# Patient Record
Sex: Male | Born: 1955 | Race: White | Hispanic: No | Marital: Married | Smoking: Former smoker
Health system: Southern US, Community
[De-identification: ages and names within clinical notes are randomized; demographics above are authoritative.]

## PROBLEM LIST (undated history)

## (undated) DIAGNOSIS — L409 Psoriasis, unspecified: Secondary | ICD-10-CM

## (undated) HISTORY — PX: TONSILLECTOMY AND ADENOIDECTOMY: SUR1326

## (undated) HISTORY — PX: HERNIA REPAIR: SHX51

## (undated) HISTORY — DX: Psoriasis, unspecified: L40.9

---

## 2009-03-09 ENCOUNTER — Ambulatory Visit: Payer: Self-pay | Admitting: Unknown Physician Specialty

## 2009-03-09 LAB — HM COLONOSCOPY

## 2014-04-28 LAB — FECAL OCCULT BLOOD, GUAIAC: Fecal Occult Blood: NEGATIVE

## 2014-05-13 LAB — LIPID PANEL
Cholesterol: 230 mg/dL — AB (ref 0–200)
HDL: 48 mg/dL (ref 35–70)
LDL Cholesterol: 145 mg/dL
LDL/HDL RATIO: 3
Triglycerides: 183 mg/dL — AB (ref 40–160)

## 2014-05-13 LAB — HEPATIC FUNCTION PANEL
ALT: 34 U/L (ref 10–40)
AST: 34 U/L (ref 14–40)
Alkaline Phosphatase: 76 U/L (ref 25–125)
BILIRUBIN, TOTAL: 0.4 mg/dL

## 2014-05-13 LAB — BASIC METABOLIC PANEL
BUN: 17 mg/dL (ref 4–21)
CREATININE: 1.2 mg/dL (ref 0.6–1.3)
Glucose: 111 mg/dL
Potassium: 4.9 mmol/L (ref 3.4–5.3)
SODIUM: 142 mmol/L (ref 137–147)

## 2014-05-13 LAB — TSH: TSH: 2.15 u[IU]/mL (ref 0.41–5.90)

## 2014-05-13 LAB — CBC AND DIFFERENTIAL
HCT: 42 % (ref 41–53)
HEMOGLOBIN: 14.6 g/dL (ref 13.5–17.5)
NEUTROS ABS: 3 /uL
Platelets: 237 10*3/uL (ref 150–399)
WBC: 4.9 10^3/mL

## 2014-05-13 LAB — HM HEPATITIS C SCREENING LAB: HM Hepatitis Screen: NEGATIVE

## 2014-05-13 LAB — PSA: PSA: 1.9

## 2014-06-11 LAB — FECAL OCCULT BLOOD, GUAIAC: Fecal Occult Blood: NEGATIVE

## 2015-05-05 ENCOUNTER — Encounter: Payer: Self-pay | Admitting: Family Medicine

## 2015-12-29 ENCOUNTER — Ambulatory Visit
Admission: RE | Admit: 2015-12-29 | Discharge: 2015-12-29 | Disposition: A | Discharge status: Home / Self Care | Payer: 59 | Source: Ambulatory Visit | Attending: Otolaryngology | Admitting: Otolaryngology

## 2015-12-29 ENCOUNTER — Other Ambulatory Visit: Payer: Self-pay | Admitting: Otolaryngology

## 2015-12-29 DIAGNOSIS — R05 Cough: Secondary | ICD-10-CM | POA: Diagnosis not present

## 2015-12-29 DIAGNOSIS — R059 Cough, unspecified: Secondary | ICD-10-CM

## 2016-03-28 ENCOUNTER — Ambulatory Visit: Payer: Self-pay | Admitting: Family Medicine

## 2016-03-28 DIAGNOSIS — Z8619 Personal history of other infectious and parasitic diseases: Secondary | ICD-10-CM | POA: Insufficient documentation

## 2016-03-28 DIAGNOSIS — F3130 Bipolar disorder, current episode depressed, mild or moderate severity, unspecified: Secondary | ICD-10-CM | POA: Insufficient documentation

## 2016-03-28 DIAGNOSIS — N486 Induration penis plastica: Secondary | ICD-10-CM | POA: Insufficient documentation

## 2016-03-29 ENCOUNTER — Encounter: Payer: Self-pay | Admitting: Family Medicine

## 2016-03-29 ENCOUNTER — Ambulatory Visit (INDEPENDENT_AMBULATORY_CARE_PROVIDER_SITE_OTHER): Payer: 59 | Admitting: Family Medicine

## 2016-03-29 VITALS — BP 132/94 | HR 80 | Temp 98.6°F | Resp 16 | Wt 252.0 lb

## 2016-03-29 DIAGNOSIS — F320 Major depressive disorder, single episode, mild: Secondary | ICD-10-CM

## 2016-03-29 DIAGNOSIS — R0683 Snoring: Secondary | ICD-10-CM | POA: Diagnosis not present

## 2016-03-29 NOTE — Progress Notes (Signed)
Subjective:  HPI  Patient states his wife told him he snores and gasps for air at night time. Patient feels refresh when he wakes up and feels fine during the day, he wanted to get this checked out because his wife asked.  Prior to Admission medications   Medication Sig Start Date End Date Taking? Authorizing Provider  buPROPion (WELLBUTRIN XL) 150 MG 24 hr tablet Take 150 mg by mouth daily.    Yes Historical Provider, MD  MULTIPLE VITAMIN PO Take by mouth.   Yes Historical Provider, MD  OMEGA-3 FATTY ACIDS PO Take by mouth.   Yes Historical Provider, MD  PARoxetine (PAXIL) 20 MG tablet Take 20 mg by mouth daily.    Yes Historical Provider, MD  clonazePAM (KLONOPIN) 0.5 MG tablet Take 0.5 mg by mouth at bedtime.     Historical Provider, MD    Patient Active Problem List   Diagnosis Date Noted  . Bipolar I disorder, most recent episode depressed ([place]) 03/28/2016  . History of chicken pox 03/28/2016  . Chronic inflammation of tunica albuginea 03/28/2016  . Major depressive disorder, single episode, mild ([place]) 10/24/2007  . Hypercholesterolemia without hypertriglyceridemia 10/24/2007  . Tobacco use 10/24/2007    History reviewed. No pertinent past medical history.  Social History   Social History  . Marital status: Married    Spouse name: N/A  . Number of children: N/A  . Years of education: N/A   Occupational History  . Not on file.   Social History Main Topics  . Smoking status: Former Smoker    Packs/day: 0.50    Years: 10.00    Types: Cigarettes    Quit date: 08/28/1996  . Smokeless tobacco: Former Systems developer    Quit date: 08/28/1996  . Alcohol use No     Comment: none  . Drug use: No  . Sexual activity: Not on file   Other Topics Concern  . Not on file   Social History Narrative  . No narrative on file    Allergies  Allergen Reactions  . Clindamycin/Lincomycin Hives  . Tetracyclines & Related     Mouth lesions/break out    Review of Systems    Constitutional: Negative.   Respiratory: Negative.        Snores and gasps for air at night   Cardiovascular: Negative.   Musculoskeletal: Negative.   Psychiatric/Behavioral: Negative.     Immunization History  Administered Date(s) Administered  . Tdap 10/24/2007   Objective:  BP (!) 132/94   Pulse 80   Temp 98.6 F (37 C)   Resp 16   Wt 252 lb (114.3 kg)   BMI 32.35 kg/m   Physical Exam  Constitutional: He is oriented to person, place, and time and well-developed, well-nourished, and in no distress.  HENT:  Head: Normocephalic and atraumatic.  Eyes: Conjunctivae are normal. Pupils are equal, round, and reactive to light.  Neck: Normal range of motion. Neck supple.  Cardiovascular: Normal rate, regular rhythm, normal heart sounds and intact distal pulses.   No murmur heard. Pulmonary/Chest: Effort normal and breath sounds normal. No respiratory distress. He has no wheezes.  Musculoskeletal: Normal range of motion. He exhibits no edema or tenderness.  Neurological: He is alert and oriented to person, place, and time. Gait normal.  Psychiatric: Mood, memory, affect and judgment normal.    Lab Results  Component Value Date   WBC 4.9 05/13/2014   HGB 14.6 05/13/2014   HCT 42 05/13/2014   PLT  237 05/13/2014   CHOL 230 (A) 05/13/2014   TRIG 183 (A) 05/13/2014   HDL 48 05/13/2014   LDLCALC 145 05/13/2014   TSH 2.15 05/13/2014   PSA 1.9 05/13/2014    CMP     Component Value Date/Time   NA 142 05/13/2014   K 4.9 05/13/2014   BUN 17 05/13/2014   CREATININE 1.2 05/13/2014   AST 34 05/13/2014   ALT 34 05/13/2014   ALKPHOS 76 05/13/2014    Assessment and Plan :  1. Snores Epworth score today is 5. Patient feels fine during the day but per wife he snores and gasps for air. Will refer for sleep study for further evaluation. Discussed with patient what this process means and what has to be done and the importance of following up with [place] after getting his CPAP machine  if he ends up needing it.  Return to clinic in 2 months follow up for possible sleep apnea and for complete physical 2. Major depressive disorder, single episode, mild (HCC) Stable. Follows specialist for this. Followed by psychiatry Patient was seen and examined by Dr. Eulas Post and note was scribed by Theressa Millard, [place].    Miguel Aschoff MD [facility] 03/29/2016 11:29 AM

## 2016-06-02 ENCOUNTER — Telehealth: Payer: Self-pay | Admitting: Family Medicine

## 2016-06-02 NOTE — Telephone Encounter (Signed)
Order for home sleep study faxed to [facility]

## 2016-12-20 IMAGING — CR DG CHEST 2V
2 series · 2 of 2 positions shown · non-contrast
Comparison: Prior report 09/11/2002.

CLINICAL DATA: Cough.

EXAM:
CHEST  2 VIEW

[chest pa]
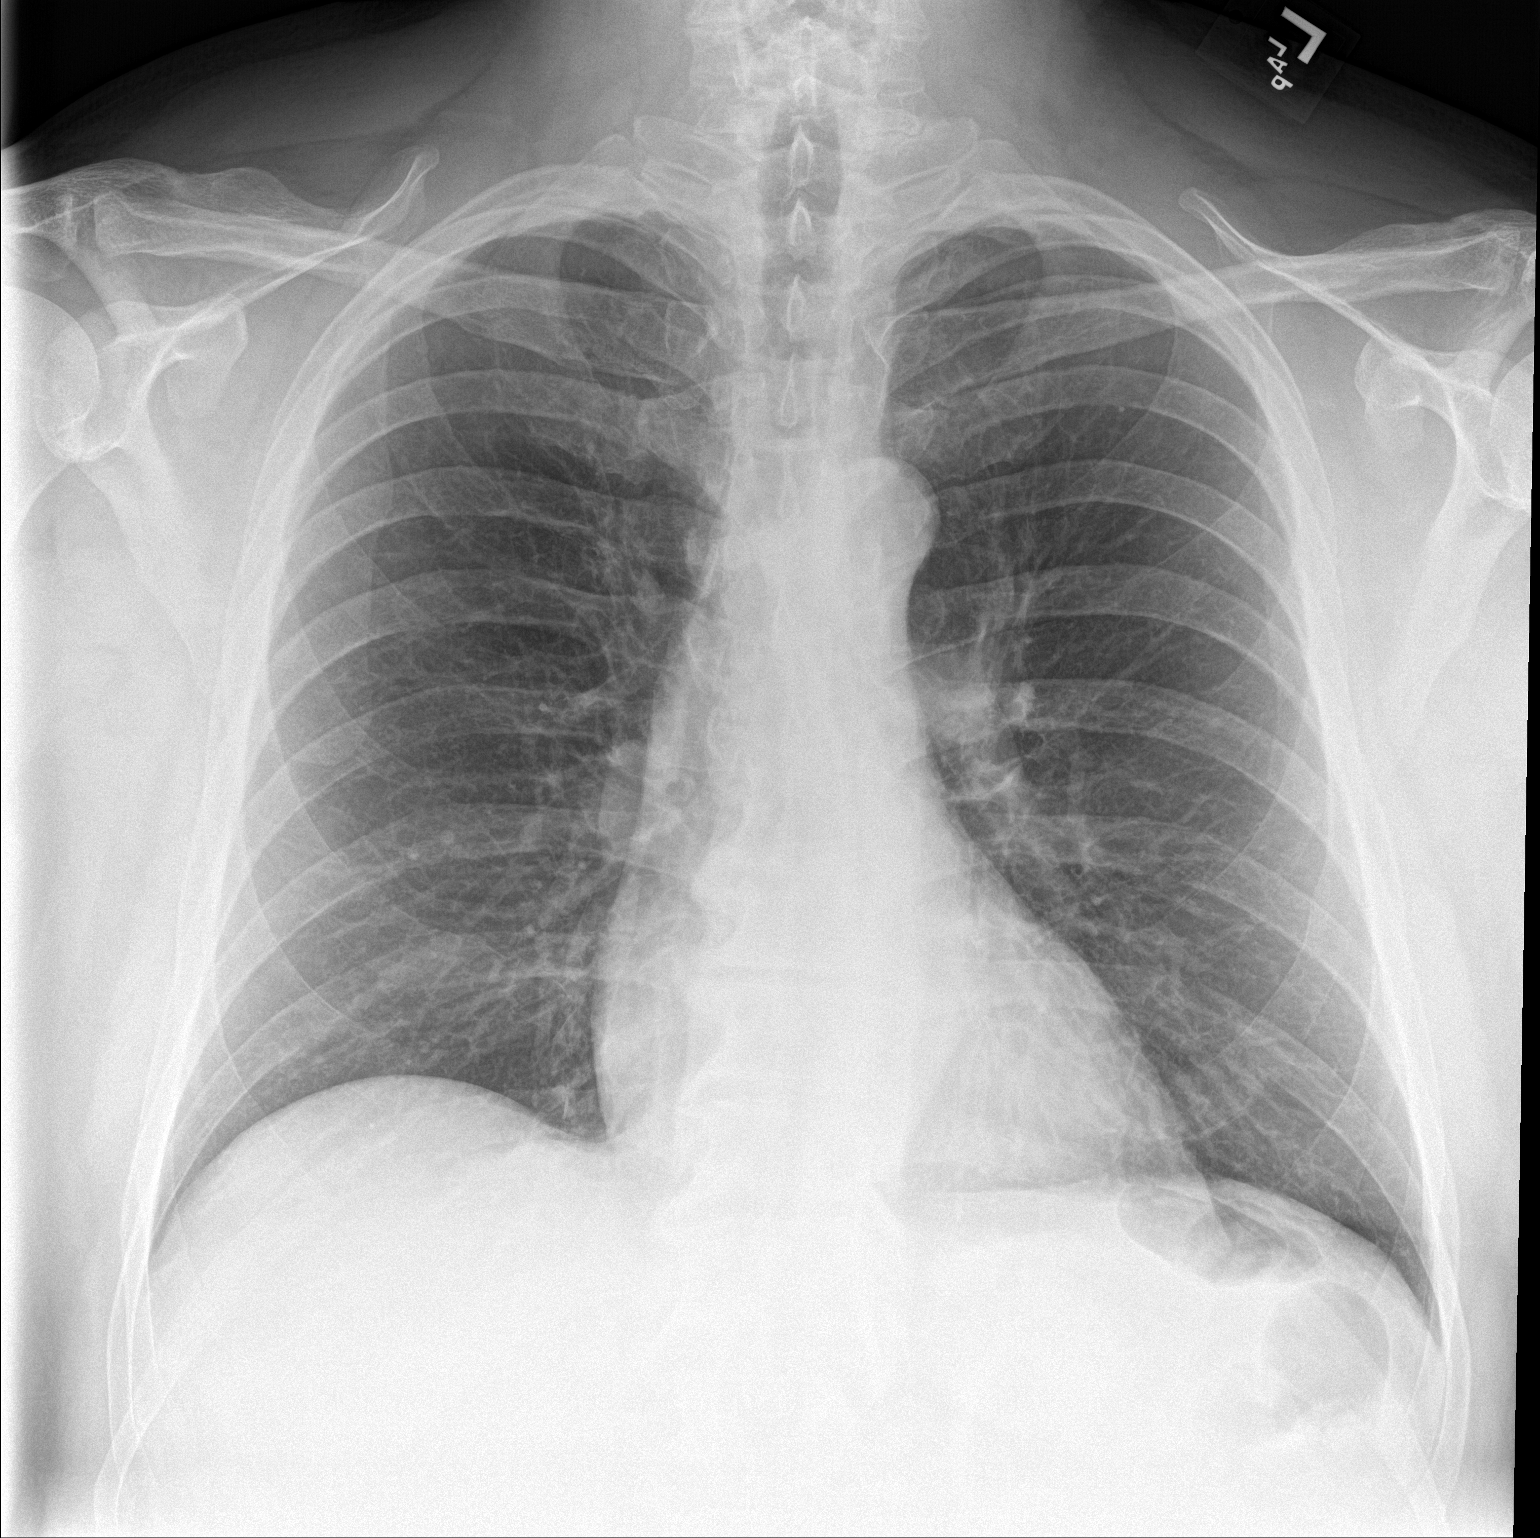

[chest lat]
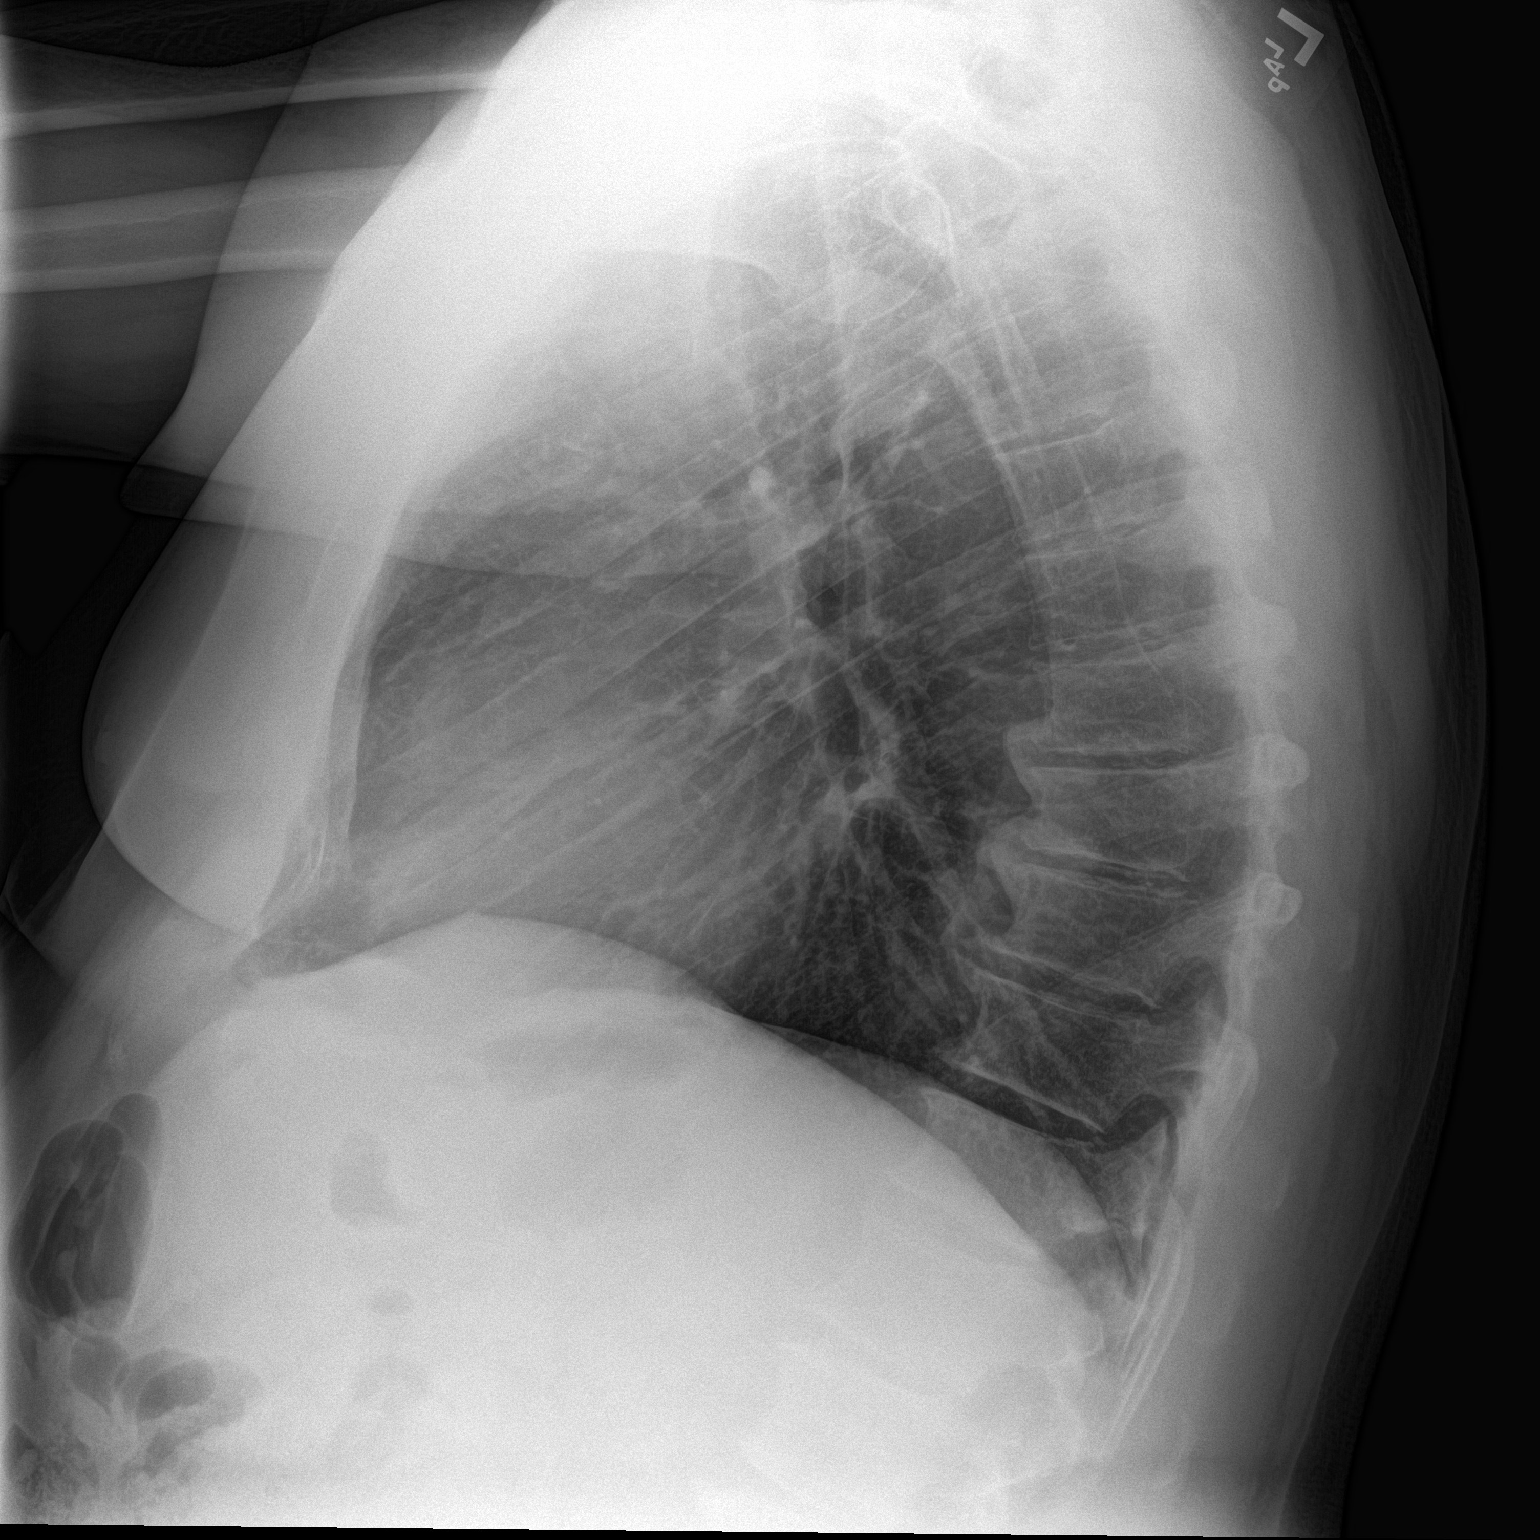

[2 of 2 positions shown; findings below may reference images not displayed]

FINDINGS: Mediastinum and hilar structures normal. Lungs are clear. Heart size
normal. No pleural effusion pneumothorax. Degenerative changes
thoracic spine.
IMPRESSION: No acute cardiopulmonary disease.

## 2018-12-23 ENCOUNTER — Ambulatory Visit: Payer: Self-pay | Admitting: Psychiatry

## 2019-01-14 ENCOUNTER — Other Ambulatory Visit: Payer: Self-pay | Admitting: Psychiatry

## 2019-01-14 NOTE — Telephone Encounter (Signed)
We will hold off doing 90-day refills at this time.  We will wait for his phone call for the refill and reschedule him and then we will do 90-day refills.

## 2019-01-14 NOTE — Telephone Encounter (Signed)
They are also requesting his clonazepam, but he's not been seen since 11/2017.   [place] [facility] tried to reach him to set up an appt., he didn't answer and couldn't leave message.

## 2019-01-29 ENCOUNTER — Other Ambulatory Visit: Payer: Self-pay

## 2019-01-29 MED ORDER — CLONAZEPAM 0.5 MG PO TABS: 0.5000 mg | ORAL_TABLET | Freq: Two times a day (BID) | ORAL | 0 refills | Status: DC | PRN

## 2019-01-29 NOTE — Telephone Encounter (Signed)
Paul Fleming called today to request refills of his paxil, wellbutrin and klonopin.  Next appt 02/12/19;  Send to [facility]

## 2019-02-12 ENCOUNTER — Encounter: Payer: Self-pay | Admitting: Psychiatry

## 2019-02-12 ENCOUNTER — Other Ambulatory Visit: Payer: Self-pay

## 2019-02-12 ENCOUNTER — Ambulatory Visit (INDEPENDENT_AMBULATORY_CARE_PROVIDER_SITE_OTHER): Payer: 59 | Admitting: Psychiatry

## 2019-02-12 DIAGNOSIS — F1021 Alcohol dependence, in remission: Secondary | ICD-10-CM

## 2019-02-12 DIAGNOSIS — F3342 Major depressive disorder, recurrent, in full remission: Secondary | ICD-10-CM

## 2019-02-12 MED ORDER — BUPROPION HCL ER (XL) 300 MG PO TB24: 300.0000 mg | ORAL_TABLET | Freq: Every day | ORAL | 3 refills | Status: DC

## 2019-02-12 MED ORDER — PAROXETINE HCL 20 MG PO TABS: 20.0000 mg | ORAL_TABLET | Freq: Every day | ORAL | 3 refills | Status: DC

## 2019-02-12 NOTE — Progress Notes (Signed)
Paul Fleming [id number] 04/13/1956 63 y.o.  Virtual Visit via Telephone Note  I connected with@ on 02/12/19 at 10:30 AM EDT by telephone and verified that I am speaking with the correct person using two identifiers.   I discussed the limitations, risks, security and privacy concerns of performing an evaluation and management service by telephone and the availability of in person appointments. I also discussed with the patient that there may be a patient responsible charge related to this service. The patient expressed understanding and agreed to proceed.   I discussed the assessment and treatment plan with the patient. The patient was provided an opportunity to ask questions and all were answered. The patient agreed with the plan and demonstrated an understanding of the instructions.   The patient was advised to call back or seek an in-person evaluation if the symptoms worsen or if the condition fails to improve as anticipated.  I provided 15 minutes of non-face-to-face time during this encounter.  The patient was located at home.  The provider was located at [place].   Purnell Shoemaker, MD   Subjective:   Patient ID:  Paul Fleming is a 63 y.o. (DOB [birth year]) male.  Chief Complaint:  Chief Complaint  Patient presents with  . Follow-up    Medication Management  . Depression    Medication Management    HPI Paul Fleming presents for follow-up of recurrent major depression.  History of 4-5 episodes.  Last seen December 24, 2017.  No meds were changed.  He continued paroxetine 20 mg daily and Wellbutrin XL 300 mg daily with rare Klonopin.  Doing fine without depression and anxiety even with stressors.  Mood consistent for years.  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  Grew up in alcoholic family.  Recognizes it and dealing it.  Sober for extended period.  Physically active.  Still rare Klonopin.  No abuse.  Patient  reports stable mood and denies depressed or irritable moods.  Patient denies any recent difficulty with anxiety.  Patient denies difficulty with sleep initiation or maintenance. Denies appetite disturbance.  Patient reports that energy and motivation have been good.  Patient denies any difficulty with concentration.  Patient denies any suicidal ideation.  Past Psychiatric Medication Trials: Paroxetine, Wellbutrin XL 300, olanzapine, lamotrigine 200, clonazepam, imipramine, alprazolam  Review of Systems:  Review of Systems  Neurological: Negative for tremors and weakness.    Medications: I have reviewed the patient's current medications.  Current Outpatient Medications  Medication Sig Dispense Refill  . buPROPion (WELLBUTRIN XL) 300 MG 24 hr tablet Take 1 tablet (300 mg total) by mouth daily. 90 tablet 3  . clonazePAM (KLONOPIN) 0.5 MG tablet Take 1 tablet (0.5 mg total) by mouth 2 (two) times daily as needed for anxiety. 60 tablet 0  . MULTIPLE VITAMIN PO Take by mouth.    . OMEGA-3 FATTY ACIDS PO Take by mouth.    Marland Kitchen PARoxetine (PAXIL) 20 MG tablet Take 1 tablet (20 mg total) by mouth daily. 90 tablet 3   No current facility-administered medications for this visit.     Medication Side Effects: None , no sexual SE. Allergies:  Allergies  Allergen Reactions  . Clindamycin/Lincomycin Hives  . Demeclocycline Other (See Comments)    Mouth lesions/break out  . Lincomycin Hives  . Tetracyclines & Related     Mouth lesions/break out    History reviewed. No pertinent past medical history.  Family History  Problem  Relation Age of Onset  . Depression Mother   . Lung cancer Father   . COPD Father   . Diabetes Father   . Breast cancer Sister   . Alcohol abuse Brother     Social History   Socioeconomic History  . Marital status: Married    Spouse name: Not on file  . Number of children: Not on file  . Years of education: Not on file  . Highest education level: Not on file   Occupational History  . Not on file  Social Needs  . Financial resource strain: Not on file  . Food insecurity    Worry: Not on file    Inability: Not on file  . Transportation needs    Medical: Not on file    Non-medical: Not on file  Tobacco Use  . Smoking status: Former Smoker    Packs/day: 0.50    Years: 10.00    Pack years: 5.00    Types: Cigarettes    Quit date: 08/28/1996    Years since quitting: 22.4  . Smokeless tobacco: Former Systems developer    Quit date: 08/28/1996  Substance and Sexual Activity  . Alcohol use: No    Comment: none  . Drug use: No  . Sexual activity: Not on file  Lifestyle  . Physical activity    Days per week: Not on file    Minutes per session: Not on file  . Stress: Not on file  Relationships  . Social Herbalist on phone: Not on file    Gets together: Not on file    Attends religious service: Not on file    Active member of club or organization: Not on file    Attends meetings of clubs or organizations: Not on file    Relationship status: Not on file  . Intimate partner violence    Fear of current or ex partner: Not on file    Emotionally abused: Not on file    Physically abused: Not on file    Forced sexual activity: Not on file  Other Topics Concern  . Not on file  Social History Narrative  . Not on file    Past Medical History, Surgical history, Social history, and Family history were reviewed and updated as appropriate.   Please see review of systems for further details on the patient's review from today.   Objective:   Physical Exam:  There were no vitals taken for this visit.  Physical Exam Constitutional:      General: He is not in acute distress.    Appearance: He is well-developed.  Musculoskeletal:        General: No deformity.  Neurological:     Mental Status: He is alert and oriented to person, place, and time.     Coordination: Coordination normal.  Psychiatric:        Attention and Perception: He is  attentive. He does not perceive auditory hallucinations.        Mood and Affect: Mood is not anxious or depressed. Affect is not labile, blunt, angry or inappropriate.        Speech: Speech normal.        Behavior: Behavior normal.        Thought Content: Thought content normal. Thought content does not include homicidal or suicidal ideation. Thought content does not include homicidal or suicidal plan.        Cognition and Memory: Cognition normal.  Judgment: Judgment normal.     Comments: Insight intact. No auditory or visual hallucinations. No delusions.      Lab Review:     Component Value Date/Time   NA 142 05/13/2014   K 4.9 05/13/2014   BUN 17 05/13/2014   CREATININE 1.2 05/13/2014   AST 34 05/13/2014   ALT 34 05/13/2014   ALKPHOS 76 05/13/2014       Component Value Date/Time   WBC 4.9 05/13/2014   HGB 14.6 05/13/2014   HCT 42 05/13/2014   PLT 237 05/13/2014    No results found for: POCLITH, LITHIUM   No results found for: PHENYTOIN, PHENOBARB, VALPROATE, CBMZ   .res Assessment: Plan:    Paul Fleming was seen today for follow-up and depression.  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 -     buPROPion (WELLBUTRIN XL) 300 MG 24 hr tablet; Take 1 tablet (300 mg total) by mouth daily.  Alcohol dependence in remission ([place])   Good response with meds.  Cont longterm bc multiple relapses.  W also happy with him on it. Sober and involved in [place] and committed.  Sober since April 2018.  FU 1 year.  Lynder Parents, MD, DFAPA   No future appointments.  No orders of the defined types were placed in this encounter.     -------------------------------

## 2019-09-30 ENCOUNTER — Other Ambulatory Visit: Payer: Self-pay | Admitting: Psychiatry

## 2019-11-06 ENCOUNTER — Ambulatory Visit: Payer: Self-pay | Attending: Internal Medicine

## 2019-11-06 DIAGNOSIS — Z23 Encounter for immunization: Secondary | ICD-10-CM

## 2019-11-06 NOTE — Progress Notes (Signed)
   Covid-19 Vaccination Clinic  Name:  ELWIN HOLLANDS    MRN: [id number] DOB: 07-Feb-1956  11/06/2019  Mr. Corso was observed post Covid-19 immunization for 15 minutes without incident. He was provided with Vaccine Information Sheet and instruction to access the V-Safe system.   Mr. Duyck was instructed to call 911 with any severe reactions post vaccine: Marland Kitchen Difficulty breathing  . Swelling of face and throat  . A fast heartbeat  . A bad rash all over body  . Dizziness and weakness   Immunizations Administered    Name Date Dose VIS Date Route   Pfizer COVID-19 Vaccine 11/06/2019 12:36 PM 0.3 mL 08/08/2019 Intramuscular   Manufacturer: [place]   Lot: [id number]   [place]: [id number]

## 2019-12-03 ENCOUNTER — Ambulatory Visit: Payer: Self-pay | Attending: Internal Medicine

## 2019-12-03 ENCOUNTER — Ambulatory Visit (INDEPENDENT_AMBULATORY_CARE_PROVIDER_SITE_OTHER): Payer: Self-pay | Admitting: Dermatology

## 2019-12-03 ENCOUNTER — Other Ambulatory Visit: Payer: Self-pay

## 2019-12-03 DIAGNOSIS — D239 Other benign neoplasm of skin, unspecified: Secondary | ICD-10-CM

## 2019-12-03 DIAGNOSIS — Z23 Encounter for immunization: Secondary | ICD-10-CM

## 2019-12-03 DIAGNOSIS — L409 Psoriasis, unspecified: Secondary | ICD-10-CM

## 2019-12-03 DIAGNOSIS — D234 Other benign neoplasm of skin of scalp and neck: Secondary | ICD-10-CM

## 2019-12-03 DIAGNOSIS — D1801 Hemangioma of skin and subcutaneous tissue: Secondary | ICD-10-CM

## 2019-12-03 MED ORDER — CALCIPOTRIENE 0.005 % EX CREA: TOPICAL_CREAM | Freq: Two times a day (BID) | CUTANEOUS | 0 refills | Status: DC

## 2019-12-03 NOTE — Progress Notes (Signed)
   Follow-Up Visit   Subjective  Paul Fleming is a 64 y.o. male who presents for the following: Follow-up (Biopsy follow up - scalp. Blue Nevus), Psoriasis (Treating with Clobetasol and is well controlled.), and Other (Spot of left post neck).    The following portions of the chart were reviewed this encounter and updated as appropriate:     Review of Systems: No other skin or systemic complaints.  Objective  Well appearing patient in no apparent distress; mood and affect are within normal limits.  All skin waist up examined.  Objective  left mid lat vertex: Well healed biopsy site.  Objective  left post base of neck: Red papules.   Objective  bilateral elbows: Thin plaques.  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 Clear today. 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 Patient declines treatment today. May consider in the future.  Psoriasis bilateral elbows  Continue Clobetasol qd up to 5 days per week prn. Advised to use sparingly and only as needed. Topical steroids (such as triamcinolone, fluocinolone, fluocinonide, mometasone, clobetasol, halobetasol, betamethasone, hydrocortisone) can cause thinning and lightening of the skin if they are used for too long in the same area. Your physician has selected the right strength medicine for your problem and area affected on the body. Please use your medication only as directed by your physician to prevent side effects. Do not use on face, groin or under arms.  Start Calcipotriene topically.  If it is affordable and works well, it can be used daily without concern for side effects like topical steroids, so use instead of Clobetasol and use Clobetasol only if needed for stubborn areas.  Discussed biologic treatments.  He is being evaluated next week for pain in left fingers.   Advised his radiological exams may show whether consistent with Psoriatic arthritis.  calcipotriene (DOVONOX) 0.005 % cream - bilateral elbows  Return if symptoms worsen or fail to improve.   I, Ashok Cordia, CMA, am acting as scribe for Sarina Ser, MD .

## 2019-12-03 NOTE — Progress Notes (Signed)
   Covid-19 Vaccination Clinic  Name:  TRAYVONNE HELLARD    MRN: [id number] DOB: 1955-10-07  12/03/2019  Mr. Bibbee was observed post Covid-19 immunization for 15 minutes without incident. He was provided with Vaccine Information Sheet and instruction to access the V-Safe system.   Mr. Fichter was instructed to call 911 with any severe reactions post vaccine: Marland Kitchen Difficulty breathing  . Swelling of face and throat  . A fast heartbeat  . A bad rash all over body  . Dizziness and weakness   Immunizations Administered    Name Date Dose VIS Date Route   Pfizer COVID-19 Vaccine 12/03/2019 11:36 AM 0.3 mL 08/08/2019 Intramuscular   Manufacturer: [place]   Lot: [id number]   [place]: [id number]

## 2019-12-04 ENCOUNTER — Encounter: Payer: Self-pay | Admitting: Dermatology

## 2020-02-12 ENCOUNTER — Telehealth (INDEPENDENT_AMBULATORY_CARE_PROVIDER_SITE_OTHER): Payer: Self-pay | Admitting: Psychiatry

## 2020-02-12 ENCOUNTER — Encounter: Payer: Self-pay | Admitting: Psychiatry

## 2020-02-12 DIAGNOSIS — F5105 Insomnia due to other mental disorder: Secondary | ICD-10-CM

## 2020-02-12 DIAGNOSIS — F3342 Major depressive disorder, recurrent, in full remission: Secondary | ICD-10-CM

## 2020-02-12 MED ORDER — PAROXETINE HCL 20 MG PO TABS: 20.0000 mg | ORAL_TABLET | Freq: Every day | ORAL | 3 refills | Status: DC

## 2020-02-12 MED ORDER — CLONAZEPAM 0.5 MG PO TABS: ORAL_TABLET | ORAL | 2 refills | Status: DC

## 2020-02-12 MED ORDER — BUPROPION HCL ER (XL) 300 MG PO TB24: 300.0000 mg | ORAL_TABLET | Freq: Every day | ORAL | 3 refills | Status: DC

## 2020-02-12 NOTE — Progress Notes (Signed)
Paul Fleming [id number] [birth year] 64 y.o.  Virtual Visit via Telephone Note  I connected with@ on 02/12/20 at  9:00 AM EDT by telephone and verified that I am speaking with the correct person using two identifiers.   I discussed the limitations, risks, security and privacy concerns of performing an evaluation and management service by telephone and the availability of in person appointments. I also discussed with the patient that there may be a patient responsible charge related to this service. The patient expressed understanding and agreed to proceed.   I discussed the assessment and treatment plan with the patient. The patient was provided an opportunity to ask questions and all were answered. The patient agreed with the plan and demonstrated an understanding of the instructions.   The patient was advised to call back or seek an in-person evaluation if the symptoms worsen or if the condition fails to improve as anticipated.  I provided 15 minutes of non-face-to-face time during this encounter.  The patient was located at home.  The provider was located at [place].   Purnell Shoemaker, MD   Subjective:   Patient ID:  Paul Fleming is a 64 y.o. (DOB Sep 26, 1955) male.  Chief Complaint:  Chief Complaint  Patient presents with  . Follow-up    anxity and depression and sleep    HPI Paul Fleming presents for follow-up of recurrent major depression.  History of 4-5 episodes.  Last seen 1 year ago.  No meds were changed.  He continued paroxetine 20 mg daily and Wellbutrin XL 300 mg daily with rare Klonopin.  Had stopped meds years ago with relapse.    Doing fine without depression and anxiety even with stressors.  Mood consistent for years.  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  Grew up in alcoholic family.  Recognizes it and dealing it.  Sober for extended period.  Physically active.  Still rare Klonopin.  No  abuse.  Patient reports stable mood and denies depressed or irritable moods.  Patient denies any recent difficulty with anxiety.  Patient denies difficulty with sleep initiation or maintenance. Denies appetite disturbance.  Patient reports that energy and motivation have been good.  Patient denies any difficulty with concentration.  Patient denies any suicidal ideation.  Disc concerns about his son's depression.    Past Psychiatric Medication Trials: Paroxetine, Wellbutrin XL 300, olanzapine, lamotrigine 200,  imipramine, clonazepam, alprazolam  Review of Systems:  Review of Systems  HENT: Positive for congestion.   Gastrointestinal: Negative for constipation.  Neurological: Negative for tremors and weakness.    Medications: I have reviewed the patient's current medications.  Current Outpatient Medications  Medication Sig Dispense Refill  . buPROPion (WELLBUTRIN XL) 300 MG 24 hr tablet Take 1 tablet (300 mg total) by mouth daily. 90 tablet 3  . calcipotriene (DOVONOX) 0.005 % cream Apply topically 2 (two) times daily. 60 g 0  . Clobetasol Propionate (TEMOVATE) 0.05 % external spray APPLY A SMALL AMOUNT TO AFFECTED AREAS OF PSORIASIS ON BODY ONCE DAILY AS NEEDED FOR UP TO 5 DAYS A WEEK. AVOID FACE, GROIN AND AXILLA    . clonazePAM (KLONOPIN) 0.5 MG tablet TAKE ONE TABLET BY MOUTH TWICE DAILY AS NEEDED FOR ANXEITY 60 tablet 2  . KERYDIN 5 % SOLN Apply  a small amount via applicator every night  apply to affected toenail    . meloxicam (MOBIC) 15 MG tablet Take 15 mg by mouth daily.    Marland Kitchen  MULTIPLE VITAMIN PO Take by mouth.    . OMEGA-3 FATTY ACIDS PO Take by mouth.    Marland Kitchen PARoxetine (PAXIL) 20 MG tablet Take 1 tablet (20 mg total) by mouth daily. 90 tablet 3   No current facility-administered medications for this visit.    Medication Side Effects: None , no sexual SE. Allergies:  Allergies  Allergen Reactions  . Clindamycin/Lincomycin Hives  . Demeclocycline Other (See Comments)     Mouth lesions/break out  . Lincomycin Hives  . Tetracyclines & Related     Mouth lesions/break out    Past Medical History:  Diagnosis Date  . Psoriasis     Family History  Problem Relation Age of Onset  . Depression Mother   . Lung cancer Father   . COPD Father   . Diabetes Father   . Breast cancer Sister   . Alcohol abuse Brother     Social History   Socioeconomic History  . Marital status: Married    Spouse name: Not on file  . Number of children: Not on file  . Years of education: Not on file  . Highest education level: Not on file  Occupational History  . Not on file  Tobacco Use  . Smoking status: Former Smoker    Packs/day: 0.50    Years: 10.00    Pack years: 5.00    Types: Cigarettes    Quit date: 08/28/1996    Years since quitting: 23.4  . Smokeless tobacco: Former Systems developer    Quit date: 08/28/1996  Substance and Sexual Activity  . Alcohol use: No    Comment: none  . Drug use: No  . Sexual activity: Not on file  Other Topics Concern  . Not on file  Social History Narrative  . Not on file   Social Determinants of Health   Financial Resource Strain:   . Difficulty of Paying Living Expenses:   Food Insecurity:   . Worried About Charity fundraiser in the Last Year:   . Arboriculturist in the Last Year:   Transportation Needs:   . Film/video editor (Medical):   Marland Kitchen Lack of Transportation (Non-Medical):   Physical Activity:   . Days of Exercise per Week:   . Minutes of Exercise per Session:   Stress:   . Feeling of Stress :   Social Connections:   . Frequency of Communication with Friends and Family:   . Frequency of Social Gatherings with Friends and Family:   . Attends Religious Services:   . Active Member of Clubs or Organizations:   . Attends Archivist Meetings:   Marland Kitchen Marital Status:   Intimate Partner Violence:   . Fear of Current or Ex-Partner:   . Emotionally Abused:   Marland Kitchen Physically Abused:   . Sexually Abused:     Past  Medical History, Surgical history, Social history, and Family history were reviewed and updated as appropriate.   Please see review of systems for further details on the patient's review from today.   Objective:   Physical Exam:  There were no vitals taken for this visit.  Physical Exam Neurological:     Mental Status: He is alert and oriented to person, place, and time.     Cranial Nerves: No dysarthria.  Psychiatric:        Attention and Perception: Attention and perception normal.        Mood and Affect: Mood normal.  Speech: Speech normal.        Behavior: Behavior is cooperative.        Thought Content: Thought content normal. Thought content is not paranoid or delusional. Thought content does not include homicidal or suicidal ideation. Thought content does not include homicidal or suicidal plan.        Cognition and Memory: Cognition and memory normal.        Judgment: Judgment normal.     Comments: Insight intact     Lab Review:     Component Value Date/Time   NA 142 05/13/2014 0000   K 4.9 05/13/2014 0000   BUN 17 05/13/2014 0000   CREATININE 1.2 05/13/2014 0000   AST 34 05/13/2014 0000   ALT 34 05/13/2014 0000   ALKPHOS 76 05/13/2014 0000       Component Value Date/Time   WBC 4.9 05/13/2014 0000   HGB 14.6 05/13/2014 0000   HCT 42 05/13/2014 0000   PLT 237 05/13/2014 0000    No results found for: POCLITH, LITHIUM   No results found for: PHENYTOIN, PHENOBARB, VALPROATE, CBMZ   .res Assessment: Plan:    Linh was seen today for follow-up.  Diagnoses and all orders for this visit:  Recurrent major depression in complete remission ([place]) -     buPROPion (WELLBUTRIN XL) 300 MG 24 hr tablet; Take 1 tablet (300 mg total) by mouth daily. -     PARoxetine (PAXIL) 20 MG tablet; Take 1 tablet (20 mg total) by mouth daily.  Insomnia due to mental condition -     clonazePAM (KLONOPIN) 0.5 MG tablet; TAKE ONE TABLET BY MOUTH TWICE DAILY AS NEEDED FOR  ANXEITY   Good response with meds.  Cont longterm bc multiple relapses.    We discussed the short-term risks associated with benzodiazepines including sedation and increased fall risk among others.  Discussed long-term side effect risk including dependence, potential withdrawal symptoms, and the potential eventual dose-related risk of dementia.  But recent studies from 2020 dispute this association between benzodiazepines and dementia risk. Newer studies in 2020 do not support an association with dementia.  W also happy with him on it. Sober and involved in [place] and committed.  Sober since April 2018.  Disc son's depression  FU 1 year.  Lynder Parents, MD, DFAPA    No future appointments.  No orders of the defined types were placed in this encounter.     -------------------------------

## 2020-09-01 NOTE — Progress Notes (Signed)
Established patient visit   Patient: Paul Fleming   DOB: 08/26/1956   65 y.o. Male  MRN: [id number] Visit Date: 09/02/2020  Today's healthcare provider: Megan Mans, MD   Chief Complaint  Patient presents with  . Depression  . Insomnia   Subjective    HPI  Patient comes in today for to reestablish after not being seen for 4 and half years.  Has been doing well. He goes to AA and is doing well emotionally.  He sees psychiatry and takes his medication regularly.  Dr. Jennelle Human in [place].  He is father of 2 sons with no grandchildren. Depression, Follow-up  He  was last seen for this 5 years ago. Changes made at last visit include no changes.   He reports excellent compliance with treatment. He is not having side effects.   He reports excellent tolerance of treatment. Current symptoms include: patient denies any symptoms He feels he is Improved since last visit.  Depression screen [facility] 2/9 09/02/2020 03/29/2016  Decreased Interest 0 0  Down, Depressed, Hopeless 0 0  PHQ - 2 Score 0 0  Altered sleeping 0 -  Tired, decreased energy 0 -  Change in appetite 0 -  Feeling bad or failure about yourself  0 -  Trouble concentrating 0 -  Moving slowly or fidgety/restless 0 -  Suicidal thoughts 0 -  PHQ-9 Score 0 -  Difficult doing work/chores Not difficult at all -    ----------------------------------------------------------------------------------------- Follow up for insomnia  The patient was last seen for this 4 years ago. Changes made at last visit include no changes.  He reports excellent compliance with treatment. Patient reports taking clonazepam 2-3 times a month only. He feels that condition is Improved. He is not having side effects.   -----------------------------------------------------------------------------------------  Patient Active Problem List   Diagnosis Date Noted  . Bipolar I disorder, most recent episode depressed (HCC) 03/28/2016   . History of chicken pox 03/28/2016  . Chronic inflammation of tunica albuginea 03/28/2016  . Major depressive disorder, single episode, mild (HCC) 10/24/2007  . Hypercholesterolemia without hypertriglyceridemia 10/24/2007  . Former tobacco use 10/24/2007   Social History   Tobacco Use  . Smoking status: Former Smoker    Packs/day: 0.50    Years: 10.00    Pack years: 5.00    Types: Cigarettes    Quit date: 08/28/1996    Years since quitting: 24.0  . Smokeless tobacco: Former Neurosurgeon    Quit date: 08/28/1996  Substance Use Topics  . Alcohol use: No    Comment: none  . Drug use: No   Allergies  Allergen Reactions  . Clindamycin/Lincomycin Hives  . Demeclocycline Other (See Comments)    Mouth lesions/break out  . Lincomycin Hives  . Tetracycline     Other reaction(s): BLISTERING In mouth  . Tetracyclines & Related     Mouth lesions/break out       Medications: Outpatient Medications Prior to Visit  Medication Sig  . buPROPion (WELLBUTRIN XL) 300 MG 24 hr tablet Take 1 tablet (300 mg total) by mouth daily.  . Clobetasol Propionate (TEMOVATE) 0.05 % external spray APPLY A SMALL AMOUNT TO AFFECTED AREAS OF PSORIASIS ON BODY ONCE DAILY AS NEEDED FOR UP TO 5 DAYS A WEEK. AVOID FACE, GROIN AND AXILLA  . clonazePAM (KLONOPIN) 0.5 MG tablet TAKE ONE TABLET BY MOUTH TWICE DAILY AS NEEDED FOR ANXEITY  . MULTIPLE VITAMIN PO Take by mouth.  . OMEGA-3 FATTY ACIDS  PO Take by mouth.  Marland Kitchen PARoxetine (PAXIL) 20 MG tablet Take 1 tablet (20 mg total) by mouth daily.  . [DISCONTINUED] calcipotriene (DOVONOX) 0.005 % cream Apply topically 2 (two) times daily. (Patient not taking: Reported on 09/02/2020)  . [DISCONTINUED] KERYDIN 5 % SOLN Apply  a small amount via applicator every night  apply to affected toenail  . [DISCONTINUED] meloxicam (MOBIC) 15 MG tablet Take 15 mg by mouth daily.   No facility-administered medications prior to visit.    Review of Systems  Constitutional: Negative.    Respiratory: Negative.   Cardiovascular: Negative.   Psychiatric/Behavioral: Negative.     Last CBC Lab Results  Component Value Date   WBC 4.9 05/13/2014   HGB 14.6 05/13/2014   HCT 42 05/13/2014   PLT 237 05/13/2014      Objective    BP 116/82 (BP Location: Left Arm, Patient Position: Sitting, Cuff Size: Large)   Pulse 72   Temp 98.2 F (36.8 C) (Oral)   Resp 16   Ht 6\' 2"  (1.88 m)   Wt 241 lb 3.2 oz (109.4 kg)   SpO2 97%   BMI 30.97 kg/m  BP Readings from Last 3 Encounters:  09/02/20 116/82  03/29/16 (!) 132/94  04/28/14 130/88   Wt Readings from Last 3 Encounters:  09/02/20 241 lb 3.2 oz (109.4 kg)  03/29/16 252 lb (114.3 kg)  04/28/14 248 lb (112.5 kg)      Physical Exam    No results found for any visits on 09/02/20.  Assessment & Plan     1. Major depressive disorder, single episode, mild (HCC) Stable on medications.  Lifelong meds and psychiatric follow-up. - TSH  2. Hypercholesterolemia without hypertriglyceridemia  - Comprehensive metabolic panel - CBC with Differential/Platelet - Lipid Panel With LDL/HDL Ratio  3. Bipolar I disorder, most recent episode depressed ([place])  - TSH  4. Prostate cancer screening  - PSA  5. Colon cancer screening Refer back to last colonoscopy from 2010 - Ambulatory referral to Gastroenterology  6. Screening for HIV (human immunodeficiency virus)  - HIV Antibody (routine testing w rflx)  7. Encounter for annual health examination CPE this spring or summer - Comprehensive metabolic panel - CBC with Differential/Platelet - Lipid Panel With LDL/HDL Ratio - PSA - TSH   No follow-ups on file.         Tamotsu Wiederholt Cranford Mon, MD  [facility] [phone] (phone) [phone] (fax)  [place]

## 2020-09-02 ENCOUNTER — Other Ambulatory Visit: Payer: Self-pay

## 2020-09-02 ENCOUNTER — Ambulatory Visit (INDEPENDENT_AMBULATORY_CARE_PROVIDER_SITE_OTHER): Payer: Self-pay | Admitting: Family Medicine

## 2020-09-02 ENCOUNTER — Encounter: Payer: Self-pay | Admitting: Family Medicine

## 2020-09-02 VITALS — BP 116/82 | HR 72 | Temp 98.2°F | Resp 16 | Ht 74.0 in | Wt 241.2 lb

## 2020-09-02 DIAGNOSIS — E7800 Pure hypercholesterolemia, unspecified: Secondary | ICD-10-CM

## 2020-09-02 DIAGNOSIS — F320 Major depressive disorder, single episode, mild: Secondary | ICD-10-CM

## 2020-09-02 DIAGNOSIS — F3130 Bipolar disorder, current episode depressed, mild or moderate severity, unspecified: Secondary | ICD-10-CM

## 2020-09-02 DIAGNOSIS — Z1211 Encounter for screening for malignant neoplasm of colon: Secondary | ICD-10-CM

## 2020-09-02 DIAGNOSIS — Z125 Encounter for screening for malignant neoplasm of prostate: Secondary | ICD-10-CM

## 2020-09-02 DIAGNOSIS — Z0000 Encounter for general adult medical examination without abnormal findings: Secondary | ICD-10-CM

## 2020-09-02 DIAGNOSIS — Z114 Encounter for screening for human immunodeficiency virus [HIV]: Secondary | ICD-10-CM

## 2020-09-02 NOTE — Patient Instructions (Signed)
Preventive Care 41-64 Years Old, Male Preventive care refers to lifestyle choices and visits with your health care provider that can promote health and wellness. This includes:  A yearly physical exam. This is also called an annual well check.  Regular dental and eye exams.  Immunizations.  Screening for certain conditions.  Healthy lifestyle choices, such as eating a healthy diet, getting regular exercise, not using drugs or products that contain nicotine and tobacco, and limiting alcohol use. What can I expect for my preventive care visit? Physical exam Your health care provider will check:  Height and weight. These may be used to calculate body mass index (BMI), which is a measurement that tells if you are at a healthy weight.  Heart rate and blood pressure.  Your skin for abnormal spots. Counseling Your health care provider may ask you questions about:  Alcohol, tobacco, and drug use.  Emotional well-being.  Home and relationship well-being.  Sexual activity.  Eating habits.  Work and work Statistician. What immunizations do I need?  Influenza (flu) vaccine  This is recommended every year. Tetanus, diphtheria, and pertussis (Tdap) vaccine  You may need a Td booster every 10 years. Varicella (chickenpox) vaccine  You may need this vaccine if you have not already been vaccinated. Zoster (shingles) vaccine  You may need this after age 64. Measles, mumps, and rubella (MMR) vaccine  You may need at least one dose of MMR if you were born in 1957 or later. You may also need a second dose. Pneumococcal conjugate (PCV13) vaccine  You may need this if you have certain conditions and were not previously vaccinated. Pneumococcal polysaccharide (PPSV23) vaccine  You may need one or two doses if you smoke cigarettes or if you have certain conditions. Meningococcal conjugate (MenACWY) vaccine  You may need this if you have certain conditions. Hepatitis A  vaccine  You may need this if you have certain conditions or if you travel or work in places where you may be exposed to hepatitis A. Hepatitis B vaccine  You may need this if you have certain conditions or if you travel or work in places where you may be exposed to hepatitis B. Haemophilus influenzae type b (Hib) vaccine  You may need this if you have certain risk factors. Human papillomavirus (HPV) vaccine  If recommended by your health care provider, you may need three doses over 6 months. You may receive vaccines as individual doses or as more than one vaccine together in one shot (combination vaccines). Talk with your health care provider about the risks and benefits of combination vaccines. What tests do I need? Blood tests  Lipid and cholesterol levels. These may be checked every 5 years, or more frequently if you are over 60 years old.  Hepatitis C test.  Hepatitis B test. Screening  Lung cancer screening. You may have this screening every year starting at age 43 if you have a 30-pack-year history of smoking and currently smoke or have quit within the past 15 years.  Prostate cancer screening. Recommendations will vary depending on your family history and other risks.  Colorectal cancer screening. All adults should have this screening starting at age 64 and continuing until age 64. Your health care provider may recommend screening at age 64 if you are at increased risk. You will have tests every 1-10 years, depending on your results and the type of screening test.  Diabetes screening. This is done by checking your blood sugar (glucose) after you have not eaten  for a while (fasting). You may have this done every 1-3 years.  Sexually transmitted disease (STD) testing. Follow these instructions at home: Eating and drinking  Eat a diet that includes fresh fruits and vegetables, whole grains, lean protein, and low-fat dairy products.  Take vitamin and mineral supplements as  recommended by your health care provider.  Do not drink alcohol if your health care provider tells you not to drink.  If you drink alcohol: ? Limit how much you have to 0-2 drinks a day. ? Be aware of how much alcohol is in your drink. In the [place] one drink equals one 12 oz bottle of beer (355 mL), one 5 oz glass of wine (148 mL), or one 1 oz glass of hard liquor (44 mL). Lifestyle  Take daily care of your teeth and gums.  Stay active. Exercise for at least 30 minutes on 5 or more days each week.  Do not use any products that contain nicotine or tobacco, such as cigarettes, e-cigarettes, and chewing tobacco. If you need help quitting, ask your health care provider.  If you are sexually active, practice safe sex. Use a condom or other form of protection to prevent STIs (sexually transmitted infections).  Talk with your health care provider about taking a low-dose aspirin every day starting at age 64. What's next?  Go to your health care provider once a year for a well check visit.  Ask your health care provider how often you should have your eyes and teeth checked.  Stay up to date on all vaccines. This information is not intended to replace advice given to you by your health care provider. Make sure you discuss any questions you have with your health care provider. Document Revised: 08/08/2018 Document Reviewed: 08/08/2018 Elsevier Patient Education  2020 [employer].

## 2020-09-03 LAB — COMPREHENSIVE METABOLIC PANEL
ALT: 30 IU/L (ref 0–44)
AST: 24 IU/L (ref 0–40)
Albumin/Globulin Ratio: 1.6 (ref 1.2–2.2)
Albumin: 4.4 g/dL (ref 3.8–4.8)
Alkaline Phosphatase: 99 IU/L (ref 44–121)
BUN/Creatinine Ratio: 16 (ref 10–24)
BUN: 16 mg/dL (ref 8–27)
Bilirubin Total: 0.3 mg/dL (ref 0.0–1.2)
CO2: 24 mmol/L (ref 20–29)
Calcium: 9.7 mg/dL (ref 8.6–10.2)
Chloride: 102 mmol/L (ref 96–106)
Creatinine, Ser: 1.03 mg/dL (ref 0.76–1.27)
GFR calc Af Amer: 89 mL/min/{1.73_m2} (ref >59)
GFR calc non Af Amer: 77 mL/min/{1.73_m2} (ref >59)
Globulin, Total: 2.8 g/dL (ref 1.5–4.5)
Glucose: 103 mg/dL — ABNORMAL HIGH (ref 65–99)
Potassium: 5.1 mmol/L (ref 3.5–5.2)
Sodium: 140 mmol/L (ref 134–144)
Total Protein: 7.2 g/dL (ref 6.0–8.5)

## 2020-09-03 LAB — CBC WITH DIFFERENTIAL/PLATELET
Basophils Absolute: 0.1 10*3/uL (ref 0.0–0.2)
Basos: 1 %
EOS (ABSOLUTE): 0.1 10*3/uL (ref 0.0–0.4)
Eos: 2 %
Hematocrit: 44 % (ref 37.5–51.0)
Hemoglobin: 14.3 g/dL (ref 13.0–17.7)
Immature Grans (Abs): 0 10*3/uL (ref 0.0–0.1)
Immature Granulocytes: 0 %
Lymphocytes Absolute: 0.8 10*3/uL (ref 0.7–3.1)
Lymphs: 21 %
MCH: 27.9 pg (ref 26.6–33.0)
MCHC: 32.5 g/dL (ref 31.5–35.7)
MCV: 86 fL (ref 79–97)
Monocytes Absolute: 0.3 10*3/uL (ref 0.1–0.9)
Monocytes: 9 %
Neutrophils Absolute: 2.6 10*3/uL (ref 1.4–7.0)
Neutrophils: 67 %
Platelets: 254 10*3/uL (ref 150–450)
RBC: 5.12 x10E6/uL (ref 4.14–5.80)
RDW: 12.9 % (ref 11.6–15.4)
WBC: 3.9 10*3/uL (ref 3.4–10.8)

## 2020-09-03 LAB — LIPID PANEL WITH LDL/HDL RATIO
Cholesterol, Total: 179 mg/dL (ref 100–199)
HDL: 38 mg/dL — ABNORMAL LOW (ref >39)
LDL Chol Calc (NIH): 118 mg/dL — ABNORMAL HIGH (ref 0–99)
LDL/HDL Ratio: 3.1 ratio (ref 0.0–3.6)
Triglycerides: 126 mg/dL (ref 0–149)
VLDL Cholesterol Cal: 23 mg/dL (ref 5–40)

## 2020-09-03 LAB — PSA: Prostate Specific Ag, Serum: 4.1 ng/mL — ABNORMAL HIGH (ref 0.0–4.0)

## 2020-09-03 LAB — HIV ANTIBODY (ROUTINE TESTING W REFLEX): HIV Screen 4th Generation wRfx: NONREACTIVE

## 2020-09-03 LAB — TSH: TSH: 2.07 u[IU]/mL (ref 0.450–4.500)

## 2020-12-01 ENCOUNTER — Encounter: Payer: Self-pay | Admitting: Family Medicine

## 2020-12-24 ENCOUNTER — Other Ambulatory Visit: Payer: Self-pay | Admitting: Dermatology

## 2021-02-22 ENCOUNTER — Other Ambulatory Visit: Payer: Self-pay | Admitting: Psychiatry

## 2021-02-22 DIAGNOSIS — F3342 Major depressive disorder, recurrent, in full remission: Secondary | ICD-10-CM

## 2021-03-07 ENCOUNTER — Encounter: Payer: Self-pay | Admitting: Family Medicine

## 2021-05-17 ENCOUNTER — Ambulatory Visit (INDEPENDENT_AMBULATORY_CARE_PROVIDER_SITE_OTHER): Payer: Self-pay | Admitting: Psychiatry

## 2021-05-17 ENCOUNTER — Encounter: Payer: Self-pay | Admitting: Psychiatry

## 2021-05-17 ENCOUNTER — Other Ambulatory Visit: Payer: Self-pay

## 2021-05-17 DIAGNOSIS — F3342 Major depressive disorder, recurrent, in full remission: Secondary | ICD-10-CM

## 2021-05-17 DIAGNOSIS — F5105 Insomnia due to other mental disorder: Secondary | ICD-10-CM

## 2021-05-17 MED ORDER — CLONAZEPAM 0.5 MG PO TABS: ORAL_TABLET | ORAL | 2 refills | Status: DC

## 2021-05-17 MED ORDER — BUPROPION HCL ER (XL) 300 MG PO TB24: 300.0000 mg | ORAL_TABLET | Freq: Every day | ORAL | 3 refills | Status: DC

## 2021-05-17 MED ORDER — PAROXETINE HCL 20 MG PO TABS: 20.0000 mg | ORAL_TABLET | Freq: Every day | ORAL | 3 refills | Status: DC

## 2021-05-17 NOTE — Progress Notes (Signed)
COBI DELPH [id number] 1955-10-16 65 y.o.   Subjective:   Patient ID:  Paul Fleming is a 65 y.o. (DOB Aug 03, 1956) male.  Chief Complaint:  Chief Complaint  Patient presents with   Follow-up   Depression    HPI Paul Fleming presents for follow-up of recurrent major depression.  History of 4-5 episodes.  Followed yearly bc stability for last several years.  He continued paroxetine 20 mg daily and Wellbutrin XL 300 mg daily with rare Klonopin.  Had stopped meds years ago with relapse.    04/2020 appt noted Doing fine without depression and anxiety even with stressors.  Mood consistent for years.  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  Grew up in alcoholic family.  Recognizes it and dealing it.  Sober for extended period.  Physically active.  Still rare Klonopin.  No abuse. Plan no med changes  05/17/2021 appt noted: Doing good still.  No depression and anxiety manageable and minimal.  Diminished a lot. Quit drinking completely and sober 4 and 1/2 years.   Family history of alcohol problems including brother. No clonazepam lately and none in months. No benadryl for sleep bc of hangover No problems with meds and no SE.  Patient reports stable mood and denies depressed or irritable moods.  Patient denies any recent difficulty with anxiety.  Patient denies difficulty with sleep initiation or maintenance. Denies appetite disturbance.  Patient reports that energy and motivation have been good.  Patient denies any difficulty with concentration.  Patient denies any suicidal ideation.  Disc concerns about his son's depression.    Past Psychiatric Medication Trials: Paroxetine, Wellbutrin XL 300, olanzapine, lamotrigine 200,  imipramine, clonazepam, alprazolam  Review of Systems:  Review of Systems  HENT:  Negative for congestion.   Gastrointestinal:  Negative for constipation.  Neurological:  Negative for tremors and weakness.    Medications: I have reviewed the patient's current medications.  Current Outpatient Medications  Medication Sig Dispense Refill   Clobetasol Propionate (TEMOVATE) 0.05 % external spray APPLY A SMALL AMOUNT TO AFFECTED AREAS OF PSORIASIS ON BODY ONCE DAILY AS NEEDED FOR UP TO 5 DAYS A WEEK. AVOID FACE, GROIN AND AXILLA 125 mL 0   MULTIPLE VITAMIN PO Take by mouth.     OMEGA-3 FATTY ACIDS PO Take by mouth.     buPROPion (WELLBUTRIN XL) 300 MG 24 hr tablet Take 1 tablet (300 mg total) by mouth daily. 90 tablet 3   clonazePAM (KLONOPIN) 0.5 MG tablet TAKE ONE TABLET BY MOUTH TWICE DAILY AS NEEDED FOR ANXEITY 60 tablet 2   PARoxetine (PAXIL) 20 MG tablet Take 1 tablet (20 mg total) by mouth daily. 90 tablet 3   No current facility-administered medications for this visit.    Medication Side Effects: None , no sexual SE. Allergies:  Allergies  Allergen Reactions   Clindamycin/Lincomycin Hives   Demeclocycline Other (See Comments)    Mouth lesions/break out   Lincomycin Hives   Tetracycline     Other reaction(s): BLISTERING In mouth   Tetracyclines & Related     Mouth lesions/break out    Past Medical History:  Diagnosis Date   Psoriasis     Family History  Problem Relation Age of Onset   Depression Mother    Lung cancer Father    COPD Father    Diabetes Father    Breast cancer Sister    Alcohol abuse Brother     Social History   Socioeconomic History  Marital status: Married    Spouse name: Not on file   Number of children: Not on file   Years of education: Not on file   Highest education level: Not on file  Occupational History   Not on file  Tobacco Use   Smoking status: Former    Packs/day: 0.50    Years: 10.00    Pack years: 5.00    Types: Cigarettes    Quit date: 08/28/1996    Years since quitting: 24.7   Smokeless tobacco: Former    Quit date: 08/28/1996  Substance and Sexual Activity   Alcohol use: No    Comment: none   Drug use: No   Sexual  activity: Not on file  Other Topics Concern   Not on file  Social History Narrative   Not on file   Social Determinants of Health   Financial Resource Strain: Not on file  Food Insecurity: Not on file  Transportation Needs: Not on file  Physical Activity: Not on file  Stress: Not on file  Social Connections: Not on file  Intimate Partner Violence: Not on file    Past Medical History, Surgical history, Social history, and Family history were reviewed and updated as appropriate.   Please see review of systems for further details on the patient's review from today.   Objective:   Physical Exam:  There were no vitals taken for this visit.  Physical Exam Neurological:     Mental Status: He is alert and oriented to person, place, and time.     Cranial Nerves: No dysarthria.  Psychiatric:        Attention and Perception: Attention and perception normal.        Mood and Affect: Mood normal. Mood is not anxious or depressed.        Speech: Speech normal.        Behavior: Behavior is cooperative.        Thought Content: Thought content normal. Thought content is not paranoid or delusional. Thought content does not include homicidal or suicidal ideation. Thought content does not include homicidal or suicidal plan.        Cognition and Memory: Cognition and memory normal.        Judgment: Judgment normal.     Comments: Insight intact    Lab Review:     Component Value Date/Time   NA 140 09/02/2020 0942   K 5.1 09/02/2020 0942   CL 102 09/02/2020 0942   CO2 24 09/02/2020 0942   GLUCOSE 103 (H) 09/02/2020 0942   BUN 16 09/02/2020 0942   CREATININE 1.03 09/02/2020 0942   CALCIUM 9.7 09/02/2020 0942   PROT 7.2 09/02/2020 0942   ALBUMIN 4.4 09/02/2020 0942   AST 24 09/02/2020 0942   ALT 30 09/02/2020 0942   ALKPHOS 99 09/02/2020 0942   BILITOT 0.3 09/02/2020 0942   GFRNONAA 77 09/02/2020 0942   GFRAA 89 09/02/2020 0942       Component Value Date/Time   WBC 3.9  09/02/2020 0942   RBC 5.12 09/02/2020 0942   HGB 14.3 09/02/2020 0942   HCT 44.0 09/02/2020 0942   PLT 254 09/02/2020 0942   MCV 86 09/02/2020 0942   MCH 27.9 09/02/2020 0942   MCHC 32.5 09/02/2020 0942   RDW 12.9 09/02/2020 0942   LYMPHSABS 0.8 09/02/2020 0942   EOSABS 0.1 09/02/2020 0942   BASOSABS 0.1 09/02/2020 0942    No results found for: POCLITH, LITHIUM   No results found for:  PHENYTOIN, PHENOBARB, VALPROATE, CBMZ   .res Assessment: Plan:    Zayne was seen today for follow-up and depression.  Diagnoses and all orders for this visit:  Recurrent major depression in complete remission ([place]) -     buPROPion (WELLBUTRIN XL) 300 MG 24 hr tablet; Take 1 tablet (300 mg total) by mouth daily. -     PARoxetine (PAXIL) 20 MG tablet; Take 1 tablet (20 mg total) by mouth daily.  Insomnia due to mental condition -     clonazePAM (KLONOPIN) 0.5 MG tablet; TAKE ONE TABLET BY MOUTH TWICE DAILY AS NEEDED FOR ANXEITY  Good response with meds.  Cont longterm bc multiple relapses.  He agrees.  We discussed the short-term risks associated with benzodiazepines including sedation and increased fall risk among others.  Discussed long-term side effect risk including dependence, potential withdrawal symptoms, and the potential eventual dose-related risk of dementia.  But recent studies from 2020 dispute this association between benzodiazepines and dementia risk. Newer studies in 2020 do not support an association with dementia.  W also happy with him on it. Sober and involved in [place] and committed.  Sober since April 2018.  Disc son's depression, Paul Fleming  FU 1 year.  Lynder Parents, MD, DFAPA    No future appointments.   No orders of the defined types were placed in this encounter.     -------------------------------

## 2021-10-03 ENCOUNTER — Ambulatory Visit (INDEPENDENT_AMBULATORY_CARE_PROVIDER_SITE_OTHER): Payer: Self-pay | Admitting: Dermatology

## 2021-10-03 ENCOUNTER — Other Ambulatory Visit: Payer: Self-pay

## 2021-10-03 DIAGNOSIS — Z1283 Encounter for screening for malignant neoplasm of skin: Secondary | ICD-10-CM

## 2021-10-03 DIAGNOSIS — D229 Melanocytic nevi, unspecified: Secondary | ICD-10-CM

## 2021-10-03 DIAGNOSIS — L814 Other melanin hyperpigmentation: Secondary | ICD-10-CM

## 2021-10-03 DIAGNOSIS — L821 Other seborrheic keratosis: Secondary | ICD-10-CM

## 2021-10-03 DIAGNOSIS — L820 Inflamed seborrheic keratosis: Secondary | ICD-10-CM

## 2021-10-03 DIAGNOSIS — D1800 Hemangioma unspecified site: Secondary | ICD-10-CM

## 2021-10-03 DIAGNOSIS — L578 Other skin changes due to chronic exposure to nonionizing radiation: Secondary | ICD-10-CM

## 2021-10-03 DIAGNOSIS — L409 Psoriasis, unspecified: Secondary | ICD-10-CM

## 2021-10-03 MED ORDER — VTAMA 1 % EX CREA: 1.0000 "application " | TOPICAL_CREAM | Freq: Every day | CUTANEOUS | 6 refills | Status: DC

## 2021-10-03 NOTE — Progress Notes (Signed)
Follow-Up Visit   Subjective  Paul Fleming is a 66 y.o. male who presents for the following: Annual Exam (Patient has noticed a lesion on the L forehead that he is concerned about and would like checked today. He has a hx of psoriasis and he uses Clobetasol on the elbows, knees, and extremities PRN flares. ). The patient presents for Total-Body Skin Exam (TBSE) for skin cancer screening and mole check.  The patient has spots, moles and lesions to be evaluated, some may be new or changing.  The following portions of the chart were reviewed this encounter and updated as appropriate:   Tobacco   Allergies   Meds   Problems   Med Hx   Surg Hx   Fam Hx      Review of Systems:  No other skin or systemic complaints except as noted in HPI or Assessment and Plan.  Objective  Well appearing patient in no apparent distress; mood and affect are within normal limits.  A full examination was performed including scalp, head, eyes, ears, nose, lips, neck, chest, axillae, abdomen, back, buttocks, bilateral upper extremities, bilateral lower extremities, hands, feet, fingers, toes, fingernails, and toenails. All findings within normal limits unless otherwise noted below.  Forehead x 1 Erythematous stuck-on, waxy papule or plaque   Assessment & Plan  Inflamed seborrheic keratosis Forehead x 1  Destruction of lesion - Forehead x 1 Complexity: simple   Destruction method: cryotherapy   Informed consent: discussed and consent obtained   Timeout:  patient name, date of birth, surgical site, and procedure verified Lesion destroyed using liquid nitrogen: Yes   Region frozen until ice ball extended beyond lesion: Yes   Outcome: patient tolerated procedure well with no complications   Post-procedure details: wound care instructions given    Psoriasis B/L elbows, back, buttocks  Psoriasis is a chronic non-curable, but treatable genetic/hereditary disease that may have other systemic features  affecting other organ systems such as joints (Psoriatic Arthritis). It is associated with an increased risk of inflammatory bowel disease, heart disease, non-alcoholic fatty liver disease, and depression.    Samples given of Vtama for patient to try QD. Consider Zorvye cream in the future if Vtama not helpful. If both too expensive continue Clobetasol QD PRN.   Tapinarof (VTAMA) 1 % CREA - B/L elbows, back, buttocks Apply 1 application topically daily. Apply to psoriasis QD PRN.  Skin cancer screening  Lentigines - Scattered tan macules - Due to sun exposure - Benign-appearing, observe - Recommend daily broad spectrum sunscreen SPF 30+ to sun-exposed areas, reapply every 2 hours as needed. - Call for any changes  Seborrheic Keratoses - Stuck-on, waxy, tan-brown papules and/or plaques  - Benign-appearing - Discussed benign etiology and prognosis. - Observe - Call for any changes  Melanocytic Nevi - Tan-brown and/or pink-flesh-colored symmetric macules and papules - Benign appearing on exam today - Observation - Call clinic for new or changing moles - Recommend daily use of broad spectrum spf 30+ sunscreen to sun-exposed areas.   Hemangiomas - Red papules - Discussed benign nature - Observe - Call for any changes  Actinic Damage - Chronic condition, secondary to cumulative UV/sun exposure - diffuse scaly erythematous macules with underlying dyspigmentation - Recommend daily broad spectrum sunscreen SPF 30+ to sun-exposed areas, reapply every 2 hours as needed.  - Staying in the shade or wearing long sleeves, sun glasses (UVA+UVB protection) and wide brim hats (4-inch brim around the entire circumference of the hat) are also  recommended for sun protection.  - Call for new or changing lesions.  Skin cancer screening performed today.  Return in about 1 year (around 10/03/2022) for TBSE.  Luther Redo, CMA, am acting as scribe for Sarina Ser, MD . Documentation: I have  reviewed the above documentation for accuracy and completeness, and I agree with the above.  Sarina Ser, MD

## 2021-10-03 NOTE — Patient Instructions (Signed)

## 2021-10-04 ENCOUNTER — Encounter: Payer: Self-pay | Admitting: Dermatology

## 2021-11-14 ENCOUNTER — Telehealth: Payer: Self-pay | Admitting: Psychiatry

## 2021-11-14 NOTE — Telephone Encounter (Signed)
Rtc to [place], son in [place]. Lurena Joiner is very hyper verbal, hypersexual, and his language was a bit inappropriate but very happy and friendly over the phone. He reports his Dad came out to visit him for 5 days and his Dad has a anxious scared look in his eyes and he thinks Lurena Joiner is paranoid. He won't go outside with Lurena Joiner, he reports his Dad has always been fun and adventurous. Lurena Joiner reports his Mom is here in [place], and agree something is wrong with the patient and not acting himself. I asked where his Dad was and he reports he was at the house but he was probably getting a hotel for the night. Informed Lurena Joiner that I would discuss things with Dr. Clovis Pu and follow up.  ? ?Informed Dr. Clovis Pu of the phone call. He will contact the pt.  ?

## 2021-11-14 NOTE — Telephone Encounter (Signed)
Patient's son Oriel Rumbold called in with concerns regarding his father. Says that he is very worried about him and would like to speak with CC. "Something is wrong with my dad and hasn't seen him this way before. Pls rtc [phone] ?

## 2021-11-14 NOTE — Telephone Encounter (Signed)
Are you able to speak to him?

## 2021-11-22 ENCOUNTER — Telehealth: Payer: Self-pay | Admitting: Psychiatry

## 2021-11-22 NOTE — Telephone Encounter (Signed)
Brother called 3:31 pm and LM to say that Paul Fleming is in the hospital in [facility]. He said Paul Fleming is 'unwell" and he'd like [place] to call the hospital there. Did not leave any other info. ?

## 2021-12-06 ENCOUNTER — Telehealth: Payer: Self-pay | Admitting: Psychiatry

## 2021-12-06 NOTE — Telephone Encounter (Signed)
Paul Fleming and his wife would like to speak with you together regarding his son Paul Fleming "Paul Fleming" and his bipolar disorder.It's not really an appt for himself. He just wants to help her understand what this means for [facility] and how to handle future episodes. ?

## 2021-12-06 NOTE — Telephone Encounter (Signed)
I am not sure you got this message  ?

## 2021-12-06 NOTE — Telephone Encounter (Signed)
Defer to Dr. Clovis Pu ?

## 2021-12-07 NOTE — Telephone Encounter (Signed)
It will be Friday before I can get to this ? ?

## 2021-12-15 ENCOUNTER — Other Ambulatory Visit: Payer: Self-pay | Admitting: Psychiatry

## 2021-12-15 DIAGNOSIS — F5105 Insomnia due to other mental disorder: Secondary | ICD-10-CM

## 2022-01-04 ENCOUNTER — Ambulatory Visit (INDEPENDENT_AMBULATORY_CARE_PROVIDER_SITE_OTHER): Payer: Self-pay | Admitting: Psychiatry

## 2022-01-04 DIAGNOSIS — F3342 Major depressive disorder, recurrent, in full remission: Secondary | ICD-10-CM

## 2022-01-04 DIAGNOSIS — F5105 Insomnia due to other mental disorder: Secondary | ICD-10-CM

## 2022-01-04 NOTE — Progress Notes (Signed)
[name] [id number] 30-Jul-1956 66 y.o.   Subjective:   Patient ID:  Paul Fleming is a 66 y.o. (DOB 06-22-1956) male.  Chief Complaint:  Chief Complaint  Patient presents with   Follow-up   Depression   Anxiety    HPI Paul Fleming presents for follow-up of recurrent major depression.  History of 4-5 episodes.  Followed yearly bc stability for last several years.  He continued paroxetine 20 mg daily and Wellbutrin XL 300 mg daily with rare Klonopin.  Had stopped meds years ago with relapse.    04/2020 appt noted Doing fine without depression and anxiety even with stressors.  Mood consistent for years.  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  Grew up in alcoholic family.  Recognizes it and dealing it.  Sober for extended period.  Physically active.  Still rare Klonopin.  No abuse. Plan no med changes  05/17/2021 appt noted: Doing good still.  No depression and anxiety manageable and minimal.  Diminished a lot. Quit drinking completely and sober 4 and 1/2 years.   Family history of alcohol problems including brother. No clonazepam lately and none in months. No benadryl for sleep bc of hangover No problems with meds and no SE. Patient reports stable mood and denies depressed or irritable moods.  Patient denies any recent difficulty with anxiety.  Patient denies difficulty with sleep initiation or maintenance. Denies appetite disturbance.  Patient reports that energy and motivation have been good.  Patient denies any difficulty with concentration.  Patient denies any suicidal ideation. Plan: continue paroxetine 20 mg daily and Wellbutrin XL 300 mg daily with rare Klonopin.   01/04/22 appt noted:  [facility].  They are concerned  with his noncompliance and poor judgment DT mania.  He's on lithium and Seroquel.  Been hospitalized several times lately for mania.  Extensive questions about how to  deal with son. Patient reports stable mood and denies depressed or irritable moods.  Patient denies any recent difficulty with anxiety.  Patient denies difficulty with sleep initiation or maintenance. Denies appetite disturbance.  Patient reports that energy and motivation have been good.  Patient denies any difficulty with concentration.  Patient denies any suicidal ideation.   Disc concerns about his son's depression.    Past Psychiatric Medication Trials: Paroxetine, Wellbutrin XL 300, olanzapine, lamotrigine 200,  imipramine, clonazepam, alprazolam  Review of Systems:  Review of Systems  HENT:  Negative for congestion.   Gastrointestinal:  Negative for constipation.  Neurological:  Negative for tremors.   Medications: I have reviewed the patient's current medications.  Current Outpatient Medications  Medication Sig Dispense Refill   buPROPion (WELLBUTRIN XL) 300 MG 24 hr tablet Take 1 tablet (300 mg total) by mouth daily. 90 tablet 3   Clobetasol Propionate (TEMOVATE) 0.05 % external spray APPLY A SMALL AMOUNT TO AFFECTED AREAS OF PSORIASIS ON BODY ONCE DAILY AS NEEDED FOR UP TO 5 DAYS A WEEK. AVOID FACE, GROIN AND AXILLA 125 mL 0   clonazePAM (KLONOPIN) 0.5 MG tablet TAKE 1 TABLET BY MOUTH 2 TIMES DAILY AS NEEDED FOR ANXEITY 60 tablet 4   MULTIPLE VITAMIN PO Take by mouth.     OMEGA-3 FATTY ACIDS PO Take by mouth.     PARoxetine (PAXIL) 20 MG tablet Take 1 tablet (20 mg total) by mouth daily. 90 tablet 3   Tapinarof (VTAMA) 1 % CREA Apply 1 application topically daily.  Apply to psoriasis QD PRN. 60 g 6   No current facility-administered medications for this visit.    Medication Side Effects: None , no sexual SE. Allergies:  Allergies  Allergen Reactions   Clindamycin/Lincomycin Hives   Demeclocycline Other (See Comments)    Mouth lesions/break out   Lincomycin Hives   Tetracycline     Other reaction(s): BLISTERING In mouth   Tetracyclines & Related     Mouth  lesions/break out    Past Medical History:  Diagnosis Date   Psoriasis     Family History  Problem Relation Age of Onset   Depression Mother    Lung cancer Father    COPD Father    Diabetes Father    Breast cancer Sister    Alcohol abuse Brother     Social History   Socioeconomic History   Marital status: Married    Spouse name: Not on file   Number of children: Not on file   Years of education: Not on file   Highest education level: Not on file  Occupational History   Not on file  Tobacco Use   Smoking status: Former    Packs/day: 0.50    Years: 10.00    Pack years: 5.00    Types: Cigarettes    Quit date: 08/28/1996    Years since quitting: 25.4   Smokeless tobacco: Former    Quit date: 08/28/1996  Substance and Sexual Activity   Alcohol use: No    Comment: none   Drug use: No   Sexual activity: Not on file  Other Topics Concern   Not on file  Social History Narrative   Not on file   Social Determinants of Health   Financial Resource Strain: Not on file  Food Insecurity: Not on file  Transportation Needs: Not on file  Physical Activity: Not on file  Stress: Not on file  Social Connections: Not on file  Intimate Partner Violence: Not on file    Past Medical History, Surgical history, Social history, and Family history were reviewed and updated as appropriate.   Please see review of systems for further details on the patient's review from today.   Objective:   Physical Exam:  There were no vitals taken for this visit.  Physical Exam Neurological:     Mental Status: He is alert and oriented to person, place, and time.     Cranial Nerves: No dysarthria.  Psychiatric:        Attention and Perception: Attention and perception normal.        Mood and Affect: Mood normal. Mood is not anxious or depressed.        Speech: Speech normal.        Behavior: Behavior is cooperative.        Thought Content: Thought content normal. Thought content is not  paranoid or delusional. Thought content does not include homicidal or suicidal ideation. Thought content does not include suicidal plan.        Cognition and Memory: Cognition and memory normal.        Judgment: Judgment normal.     Comments: Insight intact    Lab Review:     Component Value Date/Time   NA 140 09/02/2020 0942   K 5.1 09/02/2020 0942   CL 102 09/02/2020 0942   CO2 24 09/02/2020 0942   GLUCOSE 103 (H) 09/02/2020 0942   BUN 16 09/02/2020 0942   CREATININE 1.03 09/02/2020 0942   CALCIUM 9.7 09/02/2020 0942  PROT 7.2 09/02/2020 0942   ALBUMIN 4.4 09/02/2020 0942   AST 24 09/02/2020 0942   ALT 30 09/02/2020 0942   ALKPHOS 99 09/02/2020 0942   BILITOT 0.3 09/02/2020 0942   GFRNONAA 77 09/02/2020 0942   GFRAA 89 09/02/2020 0942       Component Value Date/Time   WBC 3.9 09/02/2020 0942   RBC 5.12 09/02/2020 0942   HGB 14.3 09/02/2020 0942   HCT 44.0 09/02/2020 0942   PLT 254 09/02/2020 0942   MCV 86 09/02/2020 0942   MCH 27.9 09/02/2020 0942   MCHC 32.5 09/02/2020 0942   RDW 12.9 09/02/2020 0942   LYMPHSABS 0.8 09/02/2020 0942   EOSABS 0.1 09/02/2020 0942   BASOSABS 0.1 09/02/2020 0942    No results found for: POCLITH, LITHIUM   No results found for: PHENYTOIN, PHENOBARB, VALPROATE, CBMZ   .res Assessment: Plan:    Hagen was seen today for follow-up, depression and anxiety.  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 We discussed extensively his mental health in relation to son's serious manic behavior and hosptialization.  Good response with meds.  Cont longterm bc multiple relapses.  He agrees.  We discussed the short-term risks associated with benzodiazepines including sedation and increased fall risk among others.  Discussed long-term side effect  risk including dependence, potential withdrawal symptoms, and the potential eventual dose-related risk of dementia.  But recent studies from 2020 dispute this association between benzodiazepines and dementia risk. Newer studies in 2020 do not support an association with dementia.  W also happy with him on it. Sober and involved in [place] and committed.  Sober since April 2018.  Disc son's depression, Luke  FU 1 year.  Lynder Parents, MD, DFAPA    Future Appointments  Date Time Provider [place]  05/17/2022 11:00 AM Cottle, Billey Co., MD [facility] None  10/05/2022  3:30 PM Ralene Bathe, MD [facility] None     No orders of the defined types were placed in this encounter.     -------------------------------

## 2022-01-18 ENCOUNTER — Encounter: Payer: Self-pay | Admitting: Psychiatry

## 2022-01-26 ENCOUNTER — Telehealth: Payer: Self-pay | Admitting: Psychiatry

## 2022-04-04 ENCOUNTER — Ambulatory Visit (INDEPENDENT_AMBULATORY_CARE_PROVIDER_SITE_OTHER): Payer: Self-pay | Admitting: Dermatology

## 2022-04-04 ENCOUNTER — Encounter: Payer: Self-pay | Admitting: Dermatology

## 2022-04-04 DIAGNOSIS — L409 Psoriasis, unspecified: Secondary | ICD-10-CM

## 2022-04-04 DIAGNOSIS — L821 Other seborrheic keratosis: Secondary | ICD-10-CM

## 2022-04-04 DIAGNOSIS — Z1283 Encounter for screening for malignant neoplasm of skin: Secondary | ICD-10-CM

## 2022-04-04 DIAGNOSIS — D229 Melanocytic nevi, unspecified: Secondary | ICD-10-CM

## 2022-04-04 DIAGNOSIS — D1800 Hemangioma unspecified site: Secondary | ICD-10-CM

## 2022-04-04 DIAGNOSIS — L578 Other skin changes due to chronic exposure to nonionizing radiation: Secondary | ICD-10-CM

## 2022-04-04 DIAGNOSIS — L814 Other melanin hyperpigmentation: Secondary | ICD-10-CM

## 2022-04-04 MED ORDER — OTEZLA 30 MG PO TABS: 30.0000 mg | ORAL_TABLET | Freq: Two times a day (BID) | ORAL | 3 refills | Status: DC

## 2022-04-04 MED ORDER — OTEZLA 30 MG PO TABS
30.0000 mg | ORAL_TABLET | Freq: Two times a day (BID) | ORAL | 12 refills | Status: DC
Start: 2022-04-04 — End: 2022-10-05

## 2022-04-04 NOTE — Patient Instructions (Signed)
Psoriasis is a chronic non-curable, but treatable genetic/hereditary disease that may have other systemic features affecting other organ systems such as joints (Psoriatic Arthritis). It is associated with an increased risk of inflammatory bowel disease, heart disease, non-alcoholic fatty liver disease, and depression.     Due to recent changes in healthcare laws, you may see results of your pathology and/or laboratory studies on MyChart before the doctors have had a chance to review them. We understand that in some cases there may be results that are confusing or concerning to you. Please understand that not all results are received at the same time and often the doctors may need to interpret multiple results in order to provide you with the best plan of care or course of treatment. Therefore, we ask that you please give [place] 2 business days to thoroughly review all your results before contacting the office for clarification. Should we see a critical lab result, you will be contacted sooner.   If You Need Anything After Your Visit  If you have any questions or concerns for your doctor, please call our main line at [phone] and press option 4 to reach your doctor's medical assistant. If no one answers, please leave a voicemail as directed and we will return your call as soon as possible. Messages left after 4 pm will be answered the following business day.   You may also send [place] a message via [place]. We typically respond to MyChart messages within 1-2 business days.  For prescription refills, please ask your pharmacy to contact our office. Our fax number is [phone].  If you have an urgent issue when the clinic is closed that cannot wait until the next business day, you can page your doctor at the number below.    Please note that while we do our best to be available for urgent issues outside of office hours, we are not available 24/7.   If you have an urgent issue and are unable to reach [place],  you may choose to seek medical care at your doctor's office, retail clinic, urgent care center, or emergency room.  If you have a medical emergency, please immediately call 911 or go to the emergency department.  Pager Numbers  - Dr. Nehemiah Massed: [phone]  - Dr. Laurence Ferrari: [phone]  - Dr. Nicole Kindred: [phone]  In the event of inclement weather, please call our main line at [phone] for an update on the status of any delays or closures.  Dermatology Medication Tips: Please keep the boxes that topical medications come in in order to help keep track of the instructions about where and how to use these. Pharmacies typically print the medication instructions only on the boxes and not directly on the medication tubes.   If your medication is too expensive, please contact our office at [phone] option 4 or send [place] a message through [place].   We are unable to tell what your co-pay for medications will be in advance as this is different depending on your insurance coverage. However, we may be able to find a substitute medication at lower cost or fill out paperwork to get insurance to cover a needed medication.   If a prior authorization is required to get your medication covered by your insurance company, please allow [place] 1-2 business days to complete this process.  Drug prices often vary depending on where the prescription is filled and some pharmacies may offer cheaper prices.  The website www.goodrx.com contains coupons for medications through different pharmacies. The prices here  do not account for what the cost may be with help from insurance (it may be cheaper with your insurance), but the website can give you the price if you did not use any insurance.  - You can print the associated coupon and take it with your prescription to the pharmacy.  - You may also stop by our office during regular business hours and pick up a GoodRx coupon card.  - If you need your prescription sent  electronically to a different pharmacy, notify our office through [facility] or by phone at [phone] option 4.     Si Usted Necesita Algo Despus de Su Visita  Tambin puede enviarnos un mensaje a travs de Pharmacist, community. Por lo general respondemos a los mensajes de MyChart en el transcurso de 1 a 2 das hbiles.  Para renovar recetas, por favor pida a su farmacia que se ponga en contacto con nuestra oficina. Harland Dingwall de fax es [place] [phone].  Si tiene un asunto urgente cuando la clnica est cerrada y que no puede esperar hasta el siguiente da hbil, puede llamar/localizar a su doctor(a) al nmero que aparece a continuacin.   Por favor, tenga en cuenta que aunque hacemos todo lo posible para estar disponibles para asuntos urgentes fuera del horario de [place], no estamos disponibles las 24 horas del da, los 7 das de la [place].   Si tiene un problema urgente y no puede comunicarse con nosotros, puede optar por buscar atencin mdica  en el consultorio de su doctor(a), en una clnica privada, en un centro de atencin urgente o en una sala de emergencias.  Si tiene Engineering geologist, por favor llame inmediatamente al 911 o vaya a la sala de emergencias.  Nmeros de bper  - Dr. Nehemiah Massed: [phone]  - Dra. Moye: [phone]  - Dra. Nicole Kindred: [phone]  En caso de inclemencias del [place], por favor llame a Johnsie Kindred principal al [phone] para una actualizacin sobre el [place] de cualquier retraso o cierre.  Consejos para la medicacin en dermatologa: Por favor, guarde las cajas en las que vienen los medicamentos de uso tpico para ayudarle a seguir las instrucciones sobre dnde y cmo usarlos. Las farmacias generalmente imprimen las instrucciones del medicamento slo en las cajas y no directamente en los tubos del [place].   Si su medicamento es muy caro, por favor, pngase en contacto con Zigmund Daniel llamando al [phone] y presione la  opcin 4 o envenos un mensaje a travs de Pharmacist, community.   No podemos decirle cul ser su copago por los medicamentos por adelantado ya que esto es diferente dependiendo de la cobertura de su seguro. Sin embargo, es posible que podamos encontrar un medicamento sustituto a Electrical engineer un formulario para que el seguro cubra el medicamento que se considera necesario.   Si se requiere una autorizacin previa para que su compaa de seguros [place] su medicamento, por favor permtanos de 1 a 2 das hbiles para completar este proceso.  Los precios de los medicamentos varan con frecuencia dependiendo del Environmental consultant de dnde se surte la receta y alguna farmacias pueden ofrecer precios ms baratos.  El sitio web www.goodrx.com tiene cupones para medicamentos de Airline pilot. Los precios aqu no tienen en cuenta lo que podra costar con la ayuda del seguro (puede ser ms barato con su seguro), pero el sitio web puede darle el precio si no utiliz Research scientist (physical sciences).  - Puede imprimir el cupn correspondiente y llevarlo con su receta a la farmacia.  -  Tambin puede pasar por nuestra oficina durante el horario de atencin regular y Charity fundraiser una tarjeta de cupones de GoodRx.  - Si necesita que su receta se enve electrnicamente a una farmacia diferente, informe a nuestra oficina a travs de MyChart de [place] o por telfono llamando al [phone] y presione la opcin 4.

## 2022-04-04 NOTE — Progress Notes (Signed)
Follow-Up Visit   Subjective  Paul Fleming is a 66 y.o. male who presents for the following: Rash (May be psoriasis - lower back. He has been using clobetasol spray). Pt has long personal and family history of psoriasis.  Pt has treated in past with topical steroids and light treatment (sun). The patient presents for Total-Body Skin Exam (TBSE) for skin cancer screening and mole check.  The patient has spots, moles and lesions to be evaluated, some may be new or changing and the patient has concerns that these could be cancer.  The following portions of the chart were reviewed this encounter and updated as appropriate:   Tobacco  Allergies  Meds  Problems  Med Hx  Surg Hx  Fam Hx     Review of Systems:  No other skin or systemic complaints except as noted in HPI or Assessment and Plan.  Objective  Well appearing patient in no apparent distress; mood and affect are within normal limits.  A full examination was performed including scalp, head, eyes, ears, nose, lips, neck, chest, axillae, abdomen, back, buttocks, bilateral upper extremities, bilateral lower extremities, hands, feet, fingers, toes, fingernails, and toenails. All findings within normal limits unless otherwise noted below.  Plaques of elbows, knees and trunk. Guttate plaques of legs, trunk.                    Assessment & Plan   Lentigines - Scattered tan macules - Due to sun exposure - Benign-appearing, observe - Recommend daily broad spectrum sunscreen SPF 30+ to sun-exposed areas, reapply every 2 hours as needed. - Call for any changes  Seborrheic Keratoses - Stuck-on, waxy, tan-brown papules and/or plaques  - Benign-appearing - Discussed benign etiology and prognosis. - Observe - Call for any changes  Melanocytic Nevi - Tan-brown and/or pink-flesh-colored symmetric macules and papules - Benign appearing on exam today - Observation - Call clinic for new or changing moles -  Recommend daily use of broad spectrum spf 30+ sunscreen to sun-exposed areas.   Hemangiomas - Red papules - Discussed benign nature - Observe - Call for any changes  Actinic Damage - Chronic condition, secondary to cumulative UV/sun exposure - diffuse scaly erythematous macules with underlying dyspigmentation - Recommend daily broad spectrum sunscreen SPF 30+ to sun-exposed areas, reapply every 2 hours as needed.  - Staying in the shade or wearing long sleeves, sun glasses (UVA+UVB protection) and wide brim hats (4-inch brim around the entire circumference of the hat) are also recommended for sun protection.  - Call for new or changing lesions.  Skin cancer screening performed today.  Psoriasis - BSA approx 20% See photos Chronic and persistent condition with duration or expected duration over one year. Condition is bothersome/symptomatic for patient. Currently flared. Psoriasis is a chronic non-curable, but treatable genetic/hereditary disease that may have other systemic features affecting other organ systems such as joints (Psoriatic Arthritis). It is associated with an increased risk of inflammatory bowel disease, heart disease, non-alcoholic fatty liver disease, and depression.    Discussed topical treatment vs light therapy vs Otezla vs Sotyktu vs injectable biologic treatment.  Start Otezla 30 mg 1 po qd - sample packs given. Advised patient to titrate from 10 mg up to 30 mg once daily and stay there until his follow up appointment.  Apremilast (OTEZLA) 30 MG TABS Take 1 tablet (30 mg total) by mouth 2 (two) times daily.  Related Medications Tapinarof (VTAMA) 1 % CREA Apply 1 application topically daily.  Apply to psoriasis QD PRN.  Return in about 2 months (around 06/04/2022) for Psoriasis.  I, Ashok Cordia, CMA, am acting as scribe for Sarina Ser, MD . Documentation: I have reviewed the above documentation for accuracy and completeness, and I agree with the  above.  Sarina Ser, MD

## 2022-05-17 ENCOUNTER — Encounter: Payer: Self-pay | Admitting: Psychiatry

## 2022-05-17 ENCOUNTER — Ambulatory Visit (INDEPENDENT_AMBULATORY_CARE_PROVIDER_SITE_OTHER): Payer: Self-pay | Admitting: Psychiatry

## 2022-05-17 DIAGNOSIS — F5105 Insomnia due to other mental disorder: Secondary | ICD-10-CM

## 2022-05-17 DIAGNOSIS — F3342 Major depressive disorder, recurrent, in full remission: Secondary | ICD-10-CM

## 2022-05-17 MED ORDER — PAROXETINE HCL 20 MG PO TABS: 20.0000 mg | ORAL_TABLET | Freq: Every day | ORAL | 3 refills | Status: DC

## 2022-05-17 MED ORDER — CLONAZEPAM 0.5 MG PO TABS: ORAL_TABLET | ORAL | 4 refills | Status: DC

## 2022-05-17 MED ORDER — BUPROPION HCL ER (XL) 300 MG PO TB24: 300.0000 mg | ORAL_TABLET | Freq: Every day | ORAL | 3 refills | Status: DC

## 2022-05-17 NOTE — Progress Notes (Signed)
Paul Fleming [id number] 27-Jul-1956 66 y.o.  Video Visit via My Chart  I connected with pt by video using My Chart and verified that I am speaking with the correct person using two identifiers.   I discussed the limitations, risks, security and privacy concerns of performing an evaluation and management service by My Chart  and the availability of in person appointments. I also discussed with the patient that there may be a patient responsible charge related to this service. The patient expressed understanding and agreed to proceed.  I discussed the assessment and treatment plan with the patient. The patient was provided an opportunity to ask questions and all were answered. The patient agreed with the plan and demonstrated an understanding of the instructions.   The patient was advised to call back or seek an in-person evaluation if the symptoms worsen or if the condition fails to improve as anticipated.  I provided 30 minutes of video time during this encounter.  The patient was located at home and the provider was located office. Session from 11 until 1130  Subjective:   Patient ID:  Paul Fleming is a 67 y.o. (DOB [birth year]) male.  Chief Complaint:  Chief Complaint  Patient presents with   Follow-up   Depression    HPI Paul Fleming presents for follow-up of recurrent major depression.  History of 4-5 episodes.  Followed yearly bc stability for last several years.  He continued paroxetine 20 mg daily and Wellbutrin XL 300 mg daily with rare Klonopin.  Had stopped meds years ago with relapse.    04/2020 appt noted Doing fine without depression and anxiety even with stressors.  Mood consistent for years.  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  Grew up in alcoholic family.  Recognizes it and dealing it.  Sober for extended period.  Physically active.  Still rare Klonopin.  No abuse. Plan no med changes  05/17/2021 appt noted: Doing good  still.  No depression and anxiety manageable and minimal.  Diminished a lot. Quit drinking completely and sober 4 and 1/2 years.   Family history of alcohol problems including brother. No clonazepam lately and none in months. No benadryl for sleep bc of hangover No problems with meds and no SE. Patient reports stable mood and denies depressed or irritable moods.  Patient denies any recent difficulty with anxiety.  Patient denies difficulty with sleep initiation or maintenance. Denies appetite disturbance.  Patient reports that energy and motivation have been good.  Patient denies any difficulty with concentration.  Patient denies any suicidal ideation. Plan: continue paroxetine 20 mg daily and Wellbutrin XL 300 mg daily with rare Klonopin.   01/04/22 appt noted:  [facility].  They are concerned  with his noncompliance and poor judgment DT mania.  He's on lithium and Seroquel.  Been hospitalized several times lately for mania.  Extensive questions about how to deal with son. Patient reports stable mood and denies depressed or irritable moods.  Patient denies any recent difficulty with anxiety.  Patient denies difficulty with sleep initiation or maintenance. Denies appetite disturbance.  Patient reports that energy and motivation have been good.  Patient denies any difficulty with concentration.  Patient denies any suicidal ideation.  05/17/2022 appointment noted: Son in jail off and on for psych reasons with manic psychosis.  Not cooperative with treatment.  Talked to him last night.  Trying to get mandated tx for  his psych problems.   Disc options for LAI antipsychotics to help with compliance problems. Pt doing really well.  Working going  well Clonazepam prn insomnia. Patient reports stable mood and denies depressed or irritable moods.  Patient denies any recent difficulty with anxiety.  Patient denies difficulty with sleep initiation or maintenance.  Denies appetite disturbance.  Patient reports that energy and motivation have been good.  Patient denies any difficulty with concentration.  Patient denies any suicidal ideation. Satisfied with meds.  No SE  Disc concerns about his son's depression.    Past Psychiatric Medication Trials: Paroxetine, Wellbutrin XL 300, olanzapine, lamotrigine 200,  imipramine, clonazepam, alprazolam  Review of Systems:  Review of Systems  HENT:  Negative for congestion.   Gastrointestinal:  Negative for constipation.  Neurological:  Negative for tremors and weakness.    Medications: I have reviewed the patient's current medications.  Current Outpatient Medications  Medication Sig Dispense Refill   Apremilast (OTEZLA) 30 MG TABS Take 1 tablet (30 mg total) by mouth 2 (two) times daily. 28 tablet 12   Clobetasol Propionate (TEMOVATE) 0.05 % external spray APPLY A SMALL AMOUNT TO AFFECTED AREAS OF PSORIASIS ON BODY ONCE DAILY AS NEEDED FOR UP TO 5 DAYS A WEEK. AVOID FACE, GROIN AND AXILLA 125 mL 0   MULTIPLE VITAMIN PO Take by mouth.     OMEGA-3 FATTY ACIDS PO Take by mouth.     Tapinarof (VTAMA) 1 % CREA Apply 1 application topically daily. Apply to psoriasis QD PRN. 60 g 6   buPROPion (WELLBUTRIN XL) 300 MG 24 hr tablet Take 1 tablet (300 mg total) by mouth daily. 90 tablet 3   clonazePAM (KLONOPIN) 0.5 MG tablet TAKE 1 TABLET BY MOUTH 2 TIMES DAILY AS NEEDED FOR ANXEITY 60 tablet 4   PARoxetine (PAXIL) 20 MG tablet Take 1 tablet (20 mg total) by mouth daily. 90 tablet 3   No current facility-administered medications for this visit.    Medication Side Effects: None , no sexual SE. Allergies:  Allergies  Allergen Reactions   Clindamycin/Lincomycin Hives   Demeclocycline Other (See Comments)    Mouth lesions/break out   Lincomycin Hives   Tetracycline     Other reaction(s): BLISTERING In mouth   Tetracyclines & Related     Mouth lesions/break out    Past Medical History:  Diagnosis Date    Psoriasis     Family History  Problem Relation Age of Onset   Depression Mother    Lung cancer Father    COPD Father    Diabetes Father    Breast cancer Sister    Alcohol abuse Brother     Social History   Socioeconomic History   Marital status: Married    Spouse name: Not on file   Number of children: Not on file   Years of education: Not on file   Highest education level: Not on file  Occupational History   Not on file  Tobacco Use   Smoking status: Former    Packs/day: 0.50    Years: 10.00    Total pack years: 5.00    Types: Cigarettes    Quit date: 08/28/1996    Years since quitting: 25.7   Smokeless tobacco: Former    Quit date: 08/28/1996  Substance and Sexual Activity   Alcohol use: No    Comment: none   Drug use: No   Sexual activity: Not on file  Other Topics Concern   Not on file  Social  History Narrative   Not on file   Social Determinants of Health   Financial Resource Strain: Not on file  Food Insecurity: Not on file  Transportation Needs: Not on file  Physical Activity: Not on file  Stress: Not on file  Social Connections: Not on file  Intimate Partner Violence: Not on file    Past Medical History, Surgical history, Social history, and Family history were reviewed and updated as appropriate.   Please see review of systems for further details on the patient's review from today.   Objective:   Physical Exam:  There were no vitals taken for this visit.  Physical Exam Neurological:     Mental Status: He is alert and oriented to person, place, and time.     Cranial Nerves: No dysarthria.  Psychiatric:        Attention and Perception: Attention and perception normal.        Mood and Affect: Mood normal. Mood is not anxious or depressed.        Speech: Speech normal. Speech is not slurred.        Behavior: Behavior is cooperative.        Thought Content: Thought content normal. Thought content is not paranoid or delusional. Thought content  does not include homicidal or suicidal ideation. Thought content does not include suicidal plan.        Cognition and Memory: Cognition and memory normal.        Judgment: Judgment normal.     Comments: Insight intact     Lab Review:     Component Value Date/Time   NA 140 09/02/2020 0942   K 5.1 09/02/2020 0942   CL 102 09/02/2020 0942   CO2 24 09/02/2020 0942   GLUCOSE 103 (H) 09/02/2020 0942   BUN 16 09/02/2020 0942   CREATININE 1.03 09/02/2020 0942   CALCIUM 9.7 09/02/2020 0942   PROT 7.2 09/02/2020 0942   ALBUMIN 4.4 09/02/2020 0942   AST 24 09/02/2020 0942   ALT 30 09/02/2020 0942   ALKPHOS 99 09/02/2020 0942   BILITOT 0.3 09/02/2020 0942   GFRNONAA 77 09/02/2020 0942   GFRAA 89 09/02/2020 0942       Component Value Date/Time   WBC 3.9 09/02/2020 0942   RBC 5.12 09/02/2020 0942   HGB 14.3 09/02/2020 0942   HCT 44.0 09/02/2020 0942   PLT 254 09/02/2020 0942   MCV 86 09/02/2020 0942   MCH 27.9 09/02/2020 0942   MCHC 32.5 09/02/2020 0942   RDW 12.9 09/02/2020 0942   LYMPHSABS 0.8 09/02/2020 0942   EOSABS 0.1 09/02/2020 0942   BASOSABS 0.1 09/02/2020 0942    No results found for: "POCLITH", "LITHIUM"   No results found for: "PHENYTOIN", "PHENOBARB", "VALPROATE", "CBMZ"   .res Assessment: Plan:    Bailee was seen today for follow-up and depression.  Diagnoses and all orders for this visit:  Insomnia due to mental condition -     clonazePAM (KLONOPIN) 0.5 MG tablet; TAKE 1 TABLET BY MOUTH 2 TIMES DAILY AS NEEDED FOR ANXEITY  Recurrent major depression in complete remission (HCC) -     buPROPion (WELLBUTRIN XL) 300 MG 24 hr tablet; Take 1 tablet (300 mg total) by mouth daily. -     PARoxetine (PAXIL) 20 MG tablet; Take 1 tablet (20 mg total) by mouth daily.  Greater than 50% of 30 min face to face time with patient was spent on counseling and coordination of care. We discussed extensively his mental health  in relation to son's serious manic behavior and  hosptialization.  This is ongoing stress and disc options tx for him.  Good response with meds.  Cont longterm bc multiple relapses.  He agrees.  No med changes.  We discussed the short-term risks associated with benzodiazepines including sedation and increased fall risk among others.  Discussed long-term side effect risk including dependence, potential withdrawal symptoms, and the potential eventual dose-related risk of dementia.  But recent studies from 2020 dispute this association between benzodiazepines and dementia risk. Newer studies in 2020 do not support an association with dementia.  W also happy with him on it. Sober and involved in [place] and committed.  Sober since April 2018.  Disc son's depression, Luke  FU 1 year.  Lynder Parents, MD, DFAPA    Future Appointments  Date Time Provider Roland  10/05/2022  3:30 PM Ralene Bathe, MD [facility] None     No orders of the defined types were placed in this encounter.     -------------------------------

## 2022-10-05 ENCOUNTER — Ambulatory Visit: Payer: Medicare HMO | Admitting: Dermatology

## 2022-10-05 ENCOUNTER — Telehealth: Payer: Self-pay

## 2022-10-05 DIAGNOSIS — L814 Other melanin hyperpigmentation: Secondary | ICD-10-CM

## 2022-10-05 DIAGNOSIS — D229 Melanocytic nevi, unspecified: Secondary | ICD-10-CM

## 2022-10-05 DIAGNOSIS — L409 Psoriasis, unspecified: Secondary | ICD-10-CM

## 2022-10-05 DIAGNOSIS — D234 Other benign neoplasm of skin of scalp and neck: Secondary | ICD-10-CM

## 2022-10-05 DIAGNOSIS — L821 Other seborrheic keratosis: Secondary | ICD-10-CM

## 2022-10-05 DIAGNOSIS — D1801 Hemangioma of skin and subcutaneous tissue: Secondary | ICD-10-CM

## 2022-10-05 DIAGNOSIS — D239 Other benign neoplasm of skin, unspecified: Secondary | ICD-10-CM

## 2022-10-05 DIAGNOSIS — L578 Other skin changes due to chronic exposure to nonionizing radiation: Secondary | ICD-10-CM

## 2022-10-05 DIAGNOSIS — Z1283 Encounter for screening for malignant neoplasm of skin: Secondary | ICD-10-CM

## 2022-10-05 DIAGNOSIS — Z79899 Other long term (current) drug therapy: Secondary | ICD-10-CM

## 2022-10-05 MED ORDER — OTEZLA 30 MG PO TABS: 30.0000 mg | ORAL_TABLET | Freq: Two times a day (BID) | ORAL | 11 refills | Status: DC

## 2022-10-05 MED ORDER — OTEZLA 30 MG PO TABS: 30.0000 mg | ORAL_TABLET | Freq: Two times a day (BID) | ORAL | 12 refills | Status: DC

## 2022-10-05 NOTE — Patient Instructions (Signed)
Due to recent changes in healthcare laws, you may see results of your pathology and/or laboratory studies on MyChart before the doctors have had a chance to review them. We understand that in some cases there may be results that are confusing or concerning to you. Please understand that not all results are received at the same time and often the doctors may need to interpret multiple results in order to provide you with the best plan of care or course of treatment. Therefore, we ask that you please give us 2 business days to thoroughly review all your results before contacting the office for clarification. Should we see a critical lab result, you will be contacted sooner.   If You Need Anything After Your Visit  If you have any questions or concerns for your doctor, please call our main line at [phone] and press option 4 to reach your doctor's medical assistant. If no one answers, please leave a voicemail as directed and we will return your call as soon as possible. Messages left after 4 pm will be answered the following business day.   You may also send us a message via MyChart. We typically respond to MyChart messages within 1-2 business days.  For prescription refills, please ask your pharmacy to contact our office. Our fax number is [phone].  If you have an urgent issue when the clinic is closed that cannot wait until the next business day, you can page your doctor at the number below.    Please note that while we do our best to be available for urgent issues outside of office hours, we are not available 24/7.   If you have an urgent issue and are unable to reach us, you may choose to seek medical care at your doctor's office, retail clinic, urgent care center, or emergency room.  If you have a medical emergency, please immediately call 911 or go to the emergency department.  Pager Numbers  - Dr. Kowalski: [phone]  - Dr. Moye: [phone]  - Dr. Stewart:  [phone]  In the event of inclement weather, please call our main line at [phone] for an update on the status of any delays or closures.  Dermatology Medication Tips: Please keep the boxes that topical medications come in in order to help keep track of the instructions about where and how to use these. Pharmacies typically print the medication instructions only on the boxes and not directly on the medication tubes.   If your medication is too expensive, please contact our office at [phone] option 4 or send us a message through MyChart.   We are unable to tell what your co-pay for medications will be in advance as this is different depending on your insurance coverage. However, we may be able to find a substitute medication at lower cost or fill out paperwork to get insurance to cover a needed medication.   If a prior authorization is required to get your medication covered by your insurance company, please allow us 1-2 business days to complete this process.  Drug prices often vary depending on where the prescription is filled and some pharmacies may offer cheaper prices.  The website www.goodrx.com contains coupons for medications through different pharmacies. The prices here do not account for what the cost may be with help from insurance (it may be cheaper with your insurance), but the website can give you the price if you did not use any insurance.  - You can print the associated coupon and take it with   your prescription to the pharmacy.  - You may also stop by our office during regular business hours and pick up a GoodRx coupon card.  - If you need your prescription sent electronically to a different pharmacy, notify our office through [facility] MyChart or by phone at [phone] option 4.     Si Usted Necesita Algo Despus de Su Visita  Tambin puede enviarnos un mensaje a travs de MyChart. Por lo general respondemos a los mensajes de MyChart en el transcurso de 1 a 2  das hbiles.  Para renovar recetas, por favor pida a su farmacia que se ponga en contacto con nuestra oficina. Nuestro nmero de fax es el [phone].  Si tiene un asunto urgente cuando la clnica est cerrada y que no puede esperar hasta el siguiente da hbil, puede llamar/localizar a su doctor(a) al nmero que aparece a continuacin.   Por favor, tenga en cuenta que aunque hacemos todo lo posible para estar disponibles para asuntos urgentes fuera del horario de oficina, no estamos disponibles las 24 horas del da, los 7 das de la semana.   Si tiene un problema urgente y no puede comunicarse con nosotros, puede optar por buscar atencin mdica  en el consultorio de su doctor(a), en una clnica privada, en un centro de atencin urgente o en una sala de emergencias.  Si tiene una emergencia mdica, por favor llame inmediatamente al 911 o vaya a la sala de emergencias.  Nmeros de bper  - Dr. Kowalski: [phone]  - Dra. Moye: [phone]  - Dra. Stewart: [phone]  En caso de inclemencias del tiempo, por favor llame a nuestra lnea principal al [phone] para una actualizacin sobre el estado de cualquier retraso o cierre.  Consejos para la medicacin en dermatologa: Por favor, guarde las cajas en las que vienen los medicamentos de uso tpico para ayudarle a seguir las instrucciones sobre dnde y cmo usarlos. Las farmacias generalmente imprimen las instrucciones del medicamento slo en las cajas y no directamente en los tubos del medicamento.   Si su medicamento es muy caro, por favor, pngase en contacto con nuestra oficina llamando al [phone] y presione la opcin 4 o envenos un mensaje a travs de MyChart.   No podemos decirle cul ser su copago por los medicamentos por adelantado ya que esto es diferente dependiendo de la cobertura de su seguro. Sin embargo, es posible que podamos encontrar un medicamento sustituto a menor costo o llenar un formulario para que el  seguro cubra el medicamento que se considera necesario.   Si se requiere una autorizacin previa para que su compaa de seguros cubra su medicamento, por favor permtanos de 1 a 2 das hbiles para completar este proceso.  Los precios de los medicamentos varan con frecuencia dependiendo del lugar de dnde se surte la receta y alguna farmacias pueden ofrecer precios ms baratos.  El sitio web www.goodrx.com tiene cupones para medicamentos de diferentes farmacias. Los precios aqu no tienen en cuenta lo que podra costar con la ayuda del seguro (puede ser ms barato con su seguro), pero el sitio web puede darle el precio si no utiliz ningn seguro.  - Puede imprimir el cupn correspondiente y llevarlo con su receta a la farmacia.  - Tambin puede pasar por nuestra oficina durante el horario de atencin regular y recoger una tarjeta de cupones de GoodRx.  - Si necesita que su receta se enve electrnicamente a una farmacia diferente, informe a nuestra oficina a travs de MyChart de [place]   o por telfono llamando al [phone] y presione la opcin 4.  

## 2022-10-05 NOTE — Telephone Encounter (Addendum)
Prior Authorization Clonazepam 0.5 mg  [facility] #[id number]  The patient currently has access to this medication. No PA required.

## 2022-10-05 NOTE — Progress Notes (Signed)
Follow-Up Visit   Subjective  Paul Fleming is a 67 y.o. male who presents for the following: Annual Exam (No history of skin cancer or abnormal moles - The patient presents for Total-Body Skin Exam (TBSE) for skin cancer screening and mole check.  The patient has spots, moles and lesions to be evaluated, some may be new or changing and the patient has concerns that these could be cancer./) and Psoriasis (Would like a written prescription for [place] since he now  has insurance.).  The following portions of the chart were reviewed this encounter and updated as appropriate:   Tobacco  Allergies  Meds  Problems  Med Hx  Surg Hx  Fam Hx     Review of Systems:  No other skin or systemic complaints except as noted in HPI or Assessment and Plan.  Objective  Well appearing patient in no apparent distress; mood and affect are within normal limits.  A full examination was performed including scalp, head, eyes, ears, nose, lips, neck, chest, axillae, abdomen, back, buttocks, bilateral upper extremities, bilateral lower extremities, hands, feet, fingers, toes, fingernails, and toenails. All findings within normal limits unless otherwise noted below.  Trunk and extremities Psoriatic plaques  Left scalp Well healed biopsy site   Assessment & Plan   Lentigines - Scattered tan macules - Due to sun exposure - Benign-appearing, observe - Recommend daily broad spectrum sunscreen SPF 30+ to sun-exposed areas, reapply every 2 hours as needed. - Call for any changes  Seborrheic Keratoses - Stuck-on, waxy, tan-brown papules and/or plaques  - Benign-appearing - Discussed benign etiology and prognosis. - Observe - Call for any changes  Melanocytic Nevi - Tan-brown and/or pink-flesh-colored symmetric macules and papules - Benign appearing on exam today - Observation - Call clinic for new or changing moles - Recommend daily use of broad spectrum spf 30+ sunscreen to sun-exposed areas.    Hemangiomas - Red papules - Discussed benign nature - Observe - Call for any changes  Actinic Damage - Chronic condition, secondary to cumulative UV/sun exposure - diffuse scaly erythematous macules with underlying dyspigmentation - Recommend daily broad spectrum sunscreen SPF 30+ to sun-exposed areas, reapply every 2 hours as needed.  - Staying in the shade or wearing long sleeves, sun glasses (UVA+UVB protection) and wide brim hats (4-inch brim around the entire circumference of the hat) are also recommended for sun protection.  - Call for new or changing lesions.  Skin cancer screening performed today.  Psoriasis Trunk and extremities Counseling and coordination of care for severe psoriasis on systemic treatment  psoriasis - severe on systemic treatment.  Psoriasis is a chronic non-curable, but treatable genetic/hereditary disease that may have other systemic features affecting other organ systems such as joints (Psoriatic Arthritis).  It is linked with heart disease, inflammatory bowel disease, non-alcoholic fatty liver disease, and depression. Significant skin psoriasis and/or psoriatic arthritis may have significant symptoms and affects activities of daily activity and often benefits from systemic treatments.  These systemic treatments have some potential side effects including immunosuppression and require pre-treatment laboratory screening and periodic laboratory monitoring and periodic in person evaluation and monitoring by the attending dermatologist physician (long term medication management).  Patient states he did well on [place].  He has not been taking it for a while.  He does not recall any significant side effects.  He would like to restart. Restart Otezla titrating to 30 mg twice daily.  Sample packs given Rx written. Apremilast (OTEZLA) 30 MG TABS - Trunk  and extremities Take 1 tablet (30 mg total) by mouth 2 (two) times daily. Will also try Vtama on affected  areas.  Related Medications Tapinarof (VTAMA) 1 % CREA Apply 1 application topically daily. Apply to psoriasis QD PRN.  Blue nevus Left scalp Biopsy proven blue nevus - Benign-appearing.  Observation.  Call clinic for new or changing lesions.  Recommend daily use of broad spectrum spf 30+ sunscreen to sun-exposed areas.   Return in about 6 months (around 04/05/2023).  I, Ashok Cordia, CMA, am acting as scribe for Sarina Ser, MD . Documentation: I have reviewed the above documentation for accuracy and completeness, and I agree with the above.  Sarina Ser, MD

## 2022-10-17 ENCOUNTER — Encounter: Payer: Self-pay | Admitting: Dermatology

## 2023-05-17 ENCOUNTER — Ambulatory Visit: Payer: Medicare HMO | Admitting: Dermatology

## 2023-05-17 VITALS — BP 148/70 | HR 70

## 2023-05-17 DIAGNOSIS — L4050 Arthropathic psoriasis, unspecified: Secondary | ICD-10-CM

## 2023-05-17 DIAGNOSIS — L409 Psoriasis, unspecified: Secondary | ICD-10-CM

## 2023-05-17 DIAGNOSIS — Z7189 Other specified counseling: Secondary | ICD-10-CM | POA: Diagnosis not present

## 2023-05-17 DIAGNOSIS — Z79899 Other long term (current) drug therapy: Secondary | ICD-10-CM | POA: Diagnosis not present

## 2023-05-17 MED ORDER — VTAMA 1 % EX CREA: 1.0000 "application " | TOPICAL_CREAM | Freq: Every day | CUTANEOUS | 6 refills | Status: DC

## 2023-05-17 MED ORDER — OTEZLA 30 MG PO TABS: 30.0000 mg | ORAL_TABLET | Freq: Two times a day (BID) | ORAL | 1 refills | Status: DC

## 2023-05-17 NOTE — Patient Instructions (Addendum)
Your prescription was sent to [facility] in [place]. A representative from [facility] will contact you within 3 business hours to verify your address and insurance information to schedule a free delivery. If for any reason you do not receive a phone call from them, please reach out to them. Their phone number is [phone] and their hours are Monday-Friday 9:00 am-5:00 pm.      Due to recent changes in healthcare laws, you may see results of your pathology and/or laboratory studies on MyChart before the doctors have had a chance to review them. We understand that in some cases there may be results that are confusing or concerning to you. Please understand that not all results are received at the same time and often the doctors may need to interpret multiple results in order to provide you with the best plan of care or course of treatment. Therefore, we ask that you please give [place] 2 business days to thoroughly review all your results before contacting the office for clarification. Should we see a critical lab result, you will be contacted sooner.   If You Need Anything After Your Visit  If you have any questions or concerns for your doctor, please call our main line at [phone] and press option 4 to reach your doctor's medical assistant. If no one answers, please leave a voicemail as directed and we will return your call as soon as possible. Messages left after 4 pm will be answered the following business day.   You may also send [place] a message via MyChart. We typically respond to MyChart messages within 1-2 business days.  For prescription refills, please ask your pharmacy to contact our office. Our fax number is [phone].  If you have an urgent issue when the clinic is closed that cannot wait until the next business day, you can page your doctor at the number below.    Please note that while we do our best to be available for urgent issues outside of office hours, we are not  available 24/7.   If you have an urgent issue and are unable to reach [place], you may choose to seek medical care at your doctor's office, retail clinic, urgent care center, or emergency room.  If you have a medical emergency, please immediately call 911 or go to the emergency department.  Pager Numbers  - Dr. Gwen Pounds: [phone]  - Dr. Roseanne Reno: [phone]  - Dr. Katrinka Blazing: [phone]   In the event of inclement weather, please call our main line at [phone] for an update on the status of any delays or closures.  Dermatology Medication Tips: Please keep the boxes that topical medications come in in order to help keep track of the instructions about where and how to use these. Pharmacies typically print the medication instructions only on the boxes and not directly on the medication tubes.   If your medication is too expensive, please contact our office at [phone] option 4 or send [place] a message through MyChart.   We are unable to tell what your co-pay for medications will be in advance as this is different depending on your insurance coverage. However, we may be able to find a substitute medication at lower cost or fill out paperwork to get insurance to cover a needed medication.   If a prior authorization is required to get your medication covered by your insurance company, please allow [place] 1-2 business days to complete this process.  Drug prices often vary depending on where the prescription  is filled and some pharmacies may offer cheaper prices.  The website www.goodrx.com contains coupons for medications through different pharmacies. The prices here do not account for what the cost may be with help from insurance (it may be cheaper with your insurance), but the website can give you the price if you did not use any insurance.  - You can print the associated coupon and take it with your prescription to the pharmacy.  - You may also stop by our office during regular business hours  and pick up a GoodRx coupon card.  - If you need your prescription sent electronically to a different pharmacy, notify our office through [facility] or by phone at [phone] option 4.     Si Usted Necesita Algo Despus de Su Visita  Tambin puede enviarnos un mensaje a travs de Clinical cytogeneticist. Por lo general respondemos a los mensajes de MyChart en el transcurso de 1 a 2 das hbiles.  Para renovar recetas, por favor pida a su farmacia que se ponga en contacto con nuestra oficina. Annie Sable de fax es [place] [phone].  Si tiene un asunto urgente cuando la clnica est cerrada y que no puede esperar hasta el siguiente da hbil, puede llamar/localizar a su doctor(a) al nmero que aparece a continuacin.   Por favor, tenga en cuenta que aunque hacemos todo lo posible para estar disponibles para asuntos urgentes fuera del horario de [place], no estamos disponibles las 24 horas del da, los 7 [address]  [address] de la [place].   Si tiene un problema urgente y no puede comunicarse con nosotros, puede optar por buscar atencin mdica  en el consultorio de su doctor(a), en una clnica privada, en un centro de atencin urgente o en una sala de emergencias.  Si tiene Engineer, drilling, por favor llame inmediatamente al 911 o vaya a la sala de emergencias.  Nmeros de bper  - Dr. Gwen Pounds: [phone]  - Dra. Roseanne Reno: [phone]  - Dr. Katrinka Blazing: [phone]   En caso de inclemencias del tiempo, por favor llame a Lacy Duverney principal al [phone] para una actualizacin sobre el [place] de cualquier retraso o cierre.  Consejos para la medicacin en dermatologa: Por favor, guarde las cajas en las que vienen los medicamentos de uso tpico para ayudarle a seguir las instrucciones sobre dnde y cmo usarlos. Las farmacias generalmente imprimen las instrucciones del medicamento slo en las cajas y no directamente en los tubos del [place].   Si su medicamento es muy caro, por favor, pngase  en contacto con Rolm Gala llamando al [phone] y presione la opcin 4 o envenos un mensaje a travs de Clinical cytogeneticist.   No podemos decirle cul ser su copago por los medicamentos por adelantado ya que esto es diferente dependiendo de la cobertura de su seguro. Sin embargo, es posible que podamos encontrar un medicamento sustituto a Audiological scientist un formulario para que el seguro cubra el medicamento que se considera necesario.   Si se requiere una autorizacin previa para que su compaa de seguros [place] su medicamento, por favor permtanos de 1 a 2 das hbiles para completar [address]  Los precios de los medicamentos varan con frecuencia dependiendo del Environmental consultant de dnde se surte la receta y alguna farmacias pueden ofrecer precios ms baratos.  El sitio web www.goodrx.com tiene cupones para medicamentos de Health and safety inspector. Los precios aqu no tienen en cuenta lo que podra costar con la ayuda del seguro (puede ser ms barato con su seguro), pero el sitio web  puede darle el precio si no utiliz [employer].  - Puede imprimir el cupn correspondiente y llevarlo con su receta a la farmacia.  - Tambin puede pasar por nuestra oficina durante el horario de atencin regular y Education officer, museum una tarjeta de cupones de GoodRx.  - Si necesita que su receta se enve electrnicamente a una farmacia diferente, informe a nuestra oficina a travs de MyChart de [place] o por telfono llamando al [phone] y presione la opcin 4.

## 2023-05-17 NOTE — Progress Notes (Signed)
   Follow-Up Visit   Subjective  Paul Fleming is a 67 y.o. male who presents for the following: Psoriasis - currently on generic Otezla 30 mg po BID which he buys from a pharmacy in [place], tolerating well with no s/e of mood changes, depression, or GI upsets. Pt has Clobetasol spray that he uses PRN.  The following portions of the chart were reviewed this encounter and updated as appropriate: medications, allergies, medical history  Review of Systems:  No other skin or systemic complaints except as noted in HPI or Assessment and Plan.  Objective  Well appearing patient in no apparent distress; mood and affect are within normal limits.  Areas Examined: The face, arms, and hands  Relevant exam findings are noted in the Assessment and Plan.      Assessment & Plan   PSORIASIS with psoriatic arthritis Thin plaque of the R elbow 6% BSA.  Chronic and persistent condition with duration or expected duration over one year. Condition is improving with treatment but not currently at goal.   Patient c/o joint pain which has improved with generic Otezla 30 mg po BID.   Treatment Plan: Continue generic Otezla 30 mg po BID. Side effects of Otezla (apremilast) include diarrhea, nausea, headache, upper respiratory infection, depression, and weight decrease (5-10%). It should only be taken by pregnant women after a discussion regarding risks and benefits with their doctor. Goal is control of skin condition, not cure.  The use of [place] requires long term medication management, including periodic office visits.  Start Vtama cream to aa's QD PRN.   Counseling on psoriasis and coordination of care  psoriasis is a chronic non-curable, but treatable genetic/hereditary disease that may have other systemic features affecting other organ systems such as joints (Psoriatic Arthritis). It is associated with an increased risk of inflammatory bowel disease, heart disease, non-alcoholic fatty liver  disease, and depression.  Treatments include light and laser treatments; topical medications; and systemic medications including oral and injectables.  Return in about 6 months (around 11/14/2023) for psoriasis follow up.  Maylene Roes, CMA, am acting as scribe for Armida Sans, MD .  Documentation: I have reviewed the above documentation for accuracy and completeness, and I agree with the above.  Armida Sans, MD

## 2023-05-22 ENCOUNTER — Encounter: Payer: Self-pay | Admitting: Dermatology

## 2023-10-05 ENCOUNTER — Other Ambulatory Visit: Payer: Self-pay | Admitting: Psychiatry

## 2023-10-05 DIAGNOSIS — F3342 Major depressive disorder, recurrent, in full remission: Secondary | ICD-10-CM

## 2023-10-05 NOTE — Telephone Encounter (Signed)
Call to RS

## 2023-10-29 ENCOUNTER — Other Ambulatory Visit: Payer: Self-pay

## 2023-10-29 NOTE — Progress Notes (Signed)
Error

## 2023-11-29 ENCOUNTER — Ambulatory Visit: Payer: Medicare HMO | Admitting: Dermatology

## 2023-12-11 ENCOUNTER — Ambulatory Visit: Admitting: Dermatology

## 2023-12-11 ENCOUNTER — Other Ambulatory Visit: Payer: Self-pay

## 2023-12-11 ENCOUNTER — Encounter: Payer: Self-pay | Admitting: Dermatology

## 2023-12-11 DIAGNOSIS — L578 Other skin changes due to chronic exposure to nonionizing radiation: Secondary | ICD-10-CM

## 2023-12-11 DIAGNOSIS — L4050 Arthropathic psoriasis, unspecified: Secondary | ICD-10-CM

## 2023-12-11 DIAGNOSIS — L814 Other melanin hyperpigmentation: Secondary | ICD-10-CM

## 2023-12-11 DIAGNOSIS — W908XXA Exposure to other nonionizing radiation, initial encounter: Secondary | ICD-10-CM | POA: Diagnosis not present

## 2023-12-11 DIAGNOSIS — L821 Other seborrheic keratosis: Secondary | ICD-10-CM

## 2023-12-11 DIAGNOSIS — L820 Inflamed seborrheic keratosis: Secondary | ICD-10-CM | POA: Diagnosis not present

## 2023-12-11 DIAGNOSIS — L409 Psoriasis, unspecified: Secondary | ICD-10-CM | POA: Diagnosis not present

## 2023-12-11 DIAGNOSIS — L570 Actinic keratosis: Secondary | ICD-10-CM | POA: Diagnosis not present

## 2023-12-11 DIAGNOSIS — B001 Herpesviral vesicular dermatitis: Secondary | ICD-10-CM

## 2023-12-11 DIAGNOSIS — Z1283 Encounter for screening for malignant neoplasm of skin: Secondary | ICD-10-CM | POA: Diagnosis not present

## 2023-12-11 DIAGNOSIS — Z79899 Other long term (current) drug therapy: Secondary | ICD-10-CM

## 2023-12-11 DIAGNOSIS — D1801 Hemangioma of skin and subcutaneous tissue: Secondary | ICD-10-CM

## 2023-12-11 DIAGNOSIS — D229 Melanocytic nevi, unspecified: Secondary | ICD-10-CM

## 2023-12-11 DIAGNOSIS — Z7189 Other specified counseling: Secondary | ICD-10-CM

## 2023-12-11 MED ORDER — VTAMA 1 % EX CREA: 1.0000 | TOPICAL_CREAM | Freq: Every day | CUTANEOUS | 6 refills | Status: DC

## 2023-12-11 MED ORDER — OTEZLA 30 MG PO TABS: 30.0000 mg | ORAL_TABLET | Freq: Two times a day (BID) | ORAL | 1 refills | Status: AC

## 2023-12-11 MED ORDER — VALACYCLOVIR HCL 1 G PO TABS: 1000.0000 mg | ORAL_TABLET | Freq: Two times a day (BID) | ORAL | 11 refills | Status: DC

## 2023-12-11 MED ORDER — VALACYCLOVIR HCL 1 G PO TABS: ORAL_TABLET | ORAL | 11 refills | Status: AC

## 2023-12-11 NOTE — Patient Instructions (Addendum)
 Cryotherapy Aftercare  Wash gently with soap and water everyday.   Apply Vaseline and Band-Aid daily until healed.   Your prescription was sent to [facility] in [place]. A representative from [facility] will contact you within 3 business hours to verify your address and insurance information to schedule a free delivery. If for any reason you do not receive a phone call from them, please reach out to them. Their phone number is [phone] and their hours are Monday-Friday 9:00 am-5:00 pm.     Due to recent changes in healthcare laws, you may see results of your pathology and/or laboratory studies on MyChart before the doctors have had a chance to review them. We understand that in some cases there may be results that are confusing or concerning to you. Please understand that not all results are received at the same time and often the doctors may need to interpret multiple results in order to provide you with the best plan of care or course of treatment. Therefore, we ask that you please give [place] 2 business days to thoroughly review all your results before contacting the office for clarification. Should we see a critical lab result, you will be contacted sooner.   If You Need Anything After Your Visit  If you have any questions or concerns for your doctor, please call our main line at [phone] and press option 4 to reach your doctor's medical assistant. If no one answers, please leave a voicemail as directed and we will return your call as soon as possible. Messages left after 4 pm will be answered the following business day.   You may also send [place] a message via MyChart. We typically respond to MyChart messages within 1-2 business days.  For prescription refills, please ask your pharmacy to contact our office. Our fax number is [phone].  If you have an urgent issue when the clinic is closed that cannot wait until the next business day, you can page your doctor at the number  below.    Please note that while we do our best to be available for urgent issues outside of office hours, we are not available 24/7.   If you have an urgent issue and are unable to reach [place], you may choose to seek medical care at your doctor's office, retail clinic, urgent care center, or emergency room.  If you have a medical emergency, please immediately call 911 or go to the emergency department.  Pager Numbers  - Dr. Gwen Pounds: [phone]  - Dr. Roseanne Reno: [phone]  - Dr. Katrinka Blazing: [phone]   In the event of inclement weather, please call our main line at [phone] for an update on the status of any delays or closures.  Dermatology Medication Tips: Please keep the boxes that topical medications come in in order to help keep track of the instructions about where and how to use these. Pharmacies typically print the medication instructions only on the boxes and not directly on the medication tubes.   If your medication is too expensive, please contact our office at [phone] option 4 or send [place] a message through MyChart.   We are unable to tell what your co-pay for medications will be in advance as this is different depending on your insurance coverage. However, we may be able to find a substitute medication at lower cost or fill out paperwork to get insurance to cover a needed medication.   If a prior authorization is required to get your medication covered by your insurance company,  please allow [place] 1-2 business days to complete this process.  Drug prices often vary depending on where the prescription is filled and some pharmacies may offer cheaper prices.  The website www.goodrx.com contains coupons for medications through different pharmacies. The prices here do not account for what the cost may be with help from insurance (it may be cheaper with your insurance), but the website can give you the price if you did not use any insurance.  - You can print the associated  coupon and take it with your prescription to the pharmacy.  - You may also stop by our office during regular business hours and pick up a GoodRx coupon card.  - If you need your prescription sent electronically to a different pharmacy, notify our office through [facility] or by phone at [phone] option 4.     Si Usted Necesita Algo Despus de Su Visita  Tambin puede enviarnos un mensaje a travs de Clinical cytogeneticist. Por lo general respondemos a los mensajes de MyChart en el transcurso de 1 a 2 das hbiles.  Para renovar recetas, por favor pida a su farmacia que se ponga en contacto con nuestra oficina. Annie Sable de fax es [place] [phone].  Si tiene un asunto urgente cuando la clnica est cerrada y que no puede esperar hasta el siguiente da hbil, puede llamar/localizar a su doctor(a) al nmero que aparece a continuacin.   Por favor, tenga en cuenta que aunque hacemos todo lo posible para estar disponibles para asuntos urgentes fuera del horario de Luis M. Cintron, no estamos disponibles las 24 horas del da, los 7 [address]  [address] de la [place].   Si tiene un problema urgente y no puede comunicarse con nosotros, puede optar por buscar atencin mdica  en el consultorio de su doctor(a), en una clnica privada, en un centro de atencin urgente o en una sala de emergencias.  Si tiene Engineer, drilling, por favor llame inmediatamente al 911 o vaya a la sala de emergencias.  Nmeros de bper  - Dr. Gwen Pounds: [phone]  - Dra. Roseanne Reno: [phone]  - Dr. Katrinka Blazing: [phone]   En caso de inclemencias del tiempo, por favor llame a Lacy Duverney principal al [phone] para una actualizacin sobre el [place] de cualquier retraso o cierre.  Consejos para la medicacin en dermatologa: Por favor, guarde las cajas en las que vienen los medicamentos de uso tpico para ayudarle a seguir las instrucciones sobre dnde y cmo usarlos. Las farmacias generalmente imprimen las instrucciones del  medicamento slo en las cajas y no directamente en los tubos del [place].   Si su medicamento es muy caro, por favor, pngase en contacto con Rolm Gala llamando al [phone] y presione la opcin 4 o envenos un mensaje a travs de Clinical cytogeneticist.   No podemos decirle cul ser su copago por los medicamentos por adelantado ya que esto es diferente dependiendo de la cobertura de su seguro. Sin embargo, es posible que podamos encontrar un medicamento sustituto a Audiological scientist un formulario para que el seguro cubra el medicamento que se considera necesario.   Si se requiere una autorizacin previa para que su compaa de seguros [place] su medicamento, por favor permtanos de 1 a 2 das hbiles para completar [address]  Los precios de los medicamentos varan con frecuencia dependiendo del Environmental consultant de dnde se surte la receta y alguna farmacias pueden ofrecer precios ms baratos.  El sitio web www.goodrx.com tiene cupones para medicamentos de Health and safety inspector. Los precios aqu no tienen en cuenta  lo que podra costar con la ayuda del seguro (puede ser ms barato con su seguro), pero el sitio web puede darle el precio si no Visual merchandiser.  - Puede imprimir el cupn correspondiente y llevarlo con su receta a la farmacia.  - Tambin puede pasar por nuestra oficina durante el horario de atencin regular y Education officer, museum una tarjeta de cupones de GoodRx.  - Si necesita que su receta se enve electrnicamente a una farmacia diferente, informe a nuestra oficina a travs de MyChart de [place] o por telfono llamando al [phone] y presione la opcin 4.

## 2023-12-11 NOTE — Progress Notes (Signed)
 Follow-Up Visit   Subjective  Paul Fleming is a 68 y.o. male who presents for the following: Skin Cancer Screening and Upper Body Skin Exam, Psoriasis 76m f/u, pt d/c Otezla, Vtama prn, pt feels Psoriasis doing well, check spot L scalp The patient presents for Upper Body Skin Exam (UBSE) for skin cancer screening and mole check. The patient has spots, moles and lesions to be evaluated, some may be new or changing and the patient may have concern these could be cancer.  The following portions of the chart were reviewed this encounter and updated as appropriate: medications, allergies, medical history  Review of Systems:  No other skin or systemic complaints except as noted in HPI or Assessment and Plan.  Objective  Well appearing patient in no apparent distress; mood and affect are within normal limits.  All skin waist up examined. Relevant physical exam findings are noted in the Assessment and Plan.  nose x 1, R infraorbital x 1 (2) Pink scaly macules L temple x 1 Stuck on waxy paps with erythema  Assessment & Plan   AK (ACTINIC KERATOSIS) (2) nose x 1, R infraorbital x 1 (2) Actinic keratoses are precancerous spots that appear secondary to cumulative UV radiation exposure/sun exposure over time. They are chronic with expected duration over 1 year. A portion of actinic keratoses will progress to squamous cell carcinoma of the skin. It is not possible to reliably predict which spots will progress to skin cancer and so treatment is recommended to prevent development of skin cancer.  Recommend daily broad spectrum sunscreen SPF 30+ to sun-exposed areas, reapply every 2 hours as needed.  Recommend staying in the shade or wearing long sleeves, sun glasses (UVA+UVB protection) and wide brim hats (4-inch brim around the entire circumference of the hat). Call for new or changing lesions. Destruction of lesion - nose x 1, R infraorbital x 1 (2) Complexity: simple   Destruction method:  cryotherapy   Informed consent: discussed and consent obtained   Timeout:  patient name, date of birth, surgical site, and procedure verified Lesion destroyed using liquid nitrogen: Yes   Region frozen until ice ball extended beyond lesion: Yes   Outcome: patient tolerated procedure well with no complications   Post-procedure details: wound care instructions given   INFLAMED SEBORRHEIC KERATOSIS L temple x 1 Symptomatic, irritating, patient would like treated. Destruction of lesion - L temple x 1 Complexity: simple   Destruction method: cryotherapy   Informed consent: discussed and consent obtained   Timeout:  patient name, date of birth, surgical site, and procedure verified Lesion destroyed using liquid nitrogen: Yes   Region frozen until ice ball extended beyond lesion: Yes   Outcome: patient tolerated procedure well with no complications   Post-procedure details: wound care instructions given   PSORIASIS   Related Medications [place] (VTAMA) 1 % CREA Apply 1 application  topically daily. Apply to psoriasis QD PRN.   Skin cancer screening performed today.  Actinic Damage - Chronic condition, secondary to cumulative UV/sun exposure - diffuse scaly erythematous macules with underlying dyspigmentation - Recommend daily broad spectrum sunscreen SPF 30+ to sun-exposed areas, reapply every 2 hours as needed.  - Staying in the shade or wearing long sleeves, sun glasses (UVA+UVB protection) and wide brim hats (4-inch brim around the entire circumference of the hat) are also recommended for sun protection.  - Call for new or changing lesions.  Lentigines, Seborrheic Keratoses, Hemangiomas - Benign normal skin lesions - Benign-appearing - Call for  any changes Discussed that we can do a shave removal of hemangioma of the left shoulder if traumatized or bleeding.  He understands and may consider but declines today.   Melanocytic Nevi - Tan-brown and/or pink-flesh-colored symmetric  macules and papules - Benign appearing on exam today - Observation - Call clinic for new or changing moles - Recommend daily use of broad spectrum spf 30+ sunscreen to sun-exposed areas.   PSORIASIS with PSORIATIC ARTHRITIS Patient has improved and is off Otezla oral treatment currently and feels he is doing fine without treatment except for topical Vtama. Exam: plaques elbows 5% BSA. Chronic condition with duration or expected duration over one year. Currently well-controlled. Psoriasis is a chronic non-curable, but treatable genetic/hereditary disease that may have other systemic features affecting other organ systems such as joints (Psoriatic Arthritis). It is associated with an increased risk of inflammatory bowel disease, heart disease, non-alcoholic fatty liver disease, and depression.  Treatments include light and laser treatments; topical medications; and systemic medications including oral and injectables.  Treatment Plan: Cont Vtama cr qd prn flares, samples x 3 Lot 626W exp 01/2024 May restart Otezla 1 po bid if worsens   Side effects of Otezla (apremilast) include diarrhea, nausea, headache, upper respiratory infection, depression, and weight decrease (5-10%). It should only be taken by pregnant women after a discussion regarding risks and benefits with their doctor. Goal is control of skin condition, not cure.  The use of Otezla requires long term medication management, including periodic office visits.   HERPESVIRAL INFECTION (COLD SORES) Exam: crust mid lower lip Herpes Simplex Virus = Cold Sores = Fever Blisters is a chronic recurring blistering; scabbing sore-producing viral infection that is recurrent usually in the same area triggered by stress, sun/UV exposure and trauma.  It is infectious and can be spread from person to person by direct contact.  It is not curable, but is treatable with topical and oral medication. Treatment Plan 2 po at onset of fever blisters and 2 po  12 hours later, or 1 po bid for 7 to 10 days prn flares, or 1 po qd or bid for prevention disp #60 with 1 year rfs   Return in about 6 months (around 06/11/2024) for psoriasis f/u.  I, Rollie Clipper, RMA, am acting as scribe for Celine Collard, MD .   Documentation: I have reviewed the above documentation for accuracy and completeness, and I agree with the above.  Celine Collard, MD

## 2024-02-21 ENCOUNTER — Other Ambulatory Visit: Payer: Self-pay | Admitting: Psychiatry

## 2024-02-21 DIAGNOSIS — F3342 Major depressive disorder, recurrent, in full remission: Secondary | ICD-10-CM

## 2024-04-30 ENCOUNTER — Ambulatory Visit: Payer: Self-pay | Admitting: Psychiatry

## 2024-05-27 ENCOUNTER — Encounter: Payer: Self-pay | Admitting: Psychiatry

## 2024-05-27 ENCOUNTER — Ambulatory Visit (INDEPENDENT_AMBULATORY_CARE_PROVIDER_SITE_OTHER): Payer: Self-pay | Admitting: Psychiatry

## 2024-05-27 DIAGNOSIS — F5105 Insomnia due to other mental disorder: Secondary | ICD-10-CM | POA: Diagnosis not present

## 2024-05-27 DIAGNOSIS — F3342 Major depressive disorder, recurrent, in full remission: Secondary | ICD-10-CM | POA: Diagnosis not present

## 2024-05-27 MED ORDER — CLONAZEPAM 0.5 MG PO TABS: ORAL_TABLET | ORAL | 1 refills | Status: AC

## 2024-05-27 MED ORDER — PAROXETINE HCL 20 MG PO TABS: 20.0000 mg | ORAL_TABLET | Freq: Every day | ORAL | 3 refills | Status: AC

## 2024-05-27 MED ORDER — BUPROPION HCL ER (XL) 300 MG PO TB24: 300.0000 mg | ORAL_TABLET | Freq: Every day | ORAL | 3 refills | Status: AC

## 2024-05-27 NOTE — Progress Notes (Signed)
 Paul Fleming [id number] 10-16-1955 68 y.o.    Subjective:   Patient ID:  Paul Fleming is a 68 y.o. (DOB [birth year]) male.  Chief Complaint:  Chief Complaint  Patient presents with   Follow-up    HPI Paul Fleming presents for follow-up of recurrent major depression.  History of 4-5 episodes.  Followed yearly bc stability for last several years.  He continued paroxetine  20 mg daily and Wellbutrin  XL 300 mg daily with rare Klonopin .  Had stopped meds years ago with relapse.    04/2020 appt noted Doing fine without depression and anxiety even with stressors.  Mood consistent for years.  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  Grew up in alcoholic family.  Recognizes it and dealing it.  Sober for extended period.  Physically active.  Still rare Klonopin .  No abuse. Plan no med changes  05/17/2021 appt noted: Doing good still.  No depression and anxiety manageable and minimal.  Diminished a lot. Quit drinking completely and sober 4 and 1/2 years.   Family history of alcohol problems including brother. No clonazepam  lately and none in months. No benadryl for sleep bc of hangover No problems with meds and no SE. Patient reports stable mood and denies depressed or irritable moods.  Patient denies any recent difficulty with anxiety.  Patient denies difficulty with sleep initiation or maintenance. Denies appetite disturbance.  Patient reports that energy and motivation have been good.  Patient denies any difficulty with concentration.  Patient denies any suicidal ideation. Plan: continue paroxetine  20 mg daily and Wellbutrin  XL 300 mg daily with rare Klonopin .   01/04/22 appt noted:  [facility].  They are concerned  with his noncompliance and poor judgment DT mania.  He's on lithium and Seroquel.  Been hospitalized several times lately for mania.  Extensive questions about how to deal with son. Patient  reports stable mood and denies depressed or irritable moods.  Patient denies any recent difficulty with anxiety.  Patient denies difficulty with sleep initiation or maintenance. Denies appetite disturbance.  Patient reports that energy and motivation have been good.  Patient denies any difficulty with concentration.  Patient denies any suicidal ideation.  05/17/2022 appointment noted: Son in jail off and on for psych reasons with manic psychosis.  Not cooperative with treatment.  Talked to him last night.  Trying to get mandated tx for his psych problems.   Disc options for LAI antipsychotics to help with compliance problems. Pt doing really well.  Working going  well Clonazepam  prn insomnia. Patient reports stable mood and denies depressed or irritable moods.  Patient denies any recent difficulty with anxiety.  Patient denies difficulty with sleep initiation or maintenance. Denies appetite disturbance.  Patient reports that energy and motivation have been good.  Patient denies any difficulty with concentration.  Patient denies any suicidal ideation. Satisfied with meds.  No SE  05/27/24 appt noted:  Med;  Wellbutrin  XL 300, clonazepam  0.5 mg BID prn, paroxetine  20. Rare clonazepam  and only for sleep. No sig anxiety.   Consistent with meds.  No sig dep. Stress bipolar son still in [place], [place].  Episodically noncompliant and then relapses with psychosis and verbal aggression.   Had to get out caring for him and file 50 B. 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 No SE  Disc concerns about his son's depression.    Past Psychiatric Medication Trials:  Paroxetine , Wellbutrin  XL 300, olanzapine, lamotrigine 200,  imipramine, clonazepam , alprazolam  Under my care 15-20 years.    Review of Systems:  Review of Systems  HENT:  Negative for congestion.   Gastrointestinal:  Negative for constipation.  Neurological:  Negative for tremors and weakness.    Medications: I have reviewed the  patient's current medications.  Current Outpatient Medications  Medication Sig Dispense Refill   Apremilast  (OTEZLA ) 30 MG TABS Take 1 tablet (30 mg total) by mouth 2 (two) times daily. 180 tablet 1   Clobetasol Propionate (TEMOVATE) 0.05 % external spray APPLY A SMALL AMOUNT TO AFFECTED AREAS OF PSORIASIS ON BODY ONCE DAILY AS NEEDED FOR UP TO 5 DAYS A WEEK. AVOID FACE, GROIN AND AXILLA 125 mL 0   MULTIPLE VITAMIN PO Take by mouth.     OMEGA-3 FATTY ACIDS PO Take by mouth.     Tapinarof  (VTAMA ) 1 % CREA Apply 1 application  topically daily. Apply to psoriasis QD PRN. 60 g 6   valACYclovir  (VALTREX ) 1000 MG tablet Take 2 tabs po at onset of fever blisters and 2 more tabs po 12 hours later. 60 tablet 11   buPROPion  (WELLBUTRIN  XL) 300 MG 24 hr tablet Take 1 tablet (300 mg total) by mouth daily. 90 tablet 3   clonazePAM  (KLONOPIN ) 0.5 MG tablet TAKE 1 TABLET BY MOUTH 2 TIMES DAILY AS NEEDED FOR ANXEITY 60 tablet 1   PARoxetine  (PAXIL ) 20 MG tablet Take 1 tablet (20 mg total) by mouth daily. 90 tablet 3   No current facility-administered medications for this visit.    Medication Side Effects: None , no sexual SE. Allergies:  Allergies  Allergen Reactions   Clindamycin/Lincomycin Hives   Demeclocycline Other (See Comments)    Mouth lesions/break out   Lincomycin Hives   Tetracycline     Other reaction(s): BLISTERING In mouth   Tetracyclines & Related     Mouth lesions/break out    Past Medical History:  Diagnosis Date   Psoriasis     Family History  Problem Relation Age of Onset   Depression Mother    Lung cancer Father    COPD Father    Diabetes Father    Breast cancer Sister    Alcohol abuse Brother     Social History   Socioeconomic History   Marital status: Married    Spouse name: Not on file   Number of children: Not on file   Years of education: Not on file   Highest education level: Not on file  Occupational History   Not on file  Tobacco Use   Smoking  status: Former    Current packs/day: 0.00    Average packs/day: 0.5 packs/day for 10.0 years (5.0 ttl pk-yrs)    Types: Cigarettes    Start date: 08/28/1986    Quit date: 08/28/1996    Years since quitting: 27.7   Smokeless tobacco: Former    Quit date: 08/28/1996  Substance and Sexual Activity   Alcohol use: No    Comment: none   Drug use: No   Sexual activity: Not on file  Other Topics Concern   Not on file  Social History Narrative   Not on file   Social Drivers of Health   Financial Resource Strain: Low Risk  (02/09/2024)   Received from [facility]   Overall Financial Resource Strain (CARDIA)    Difficulty of Paying Living Expenses: Not hard at all  Food Insecurity: No Food Insecurity (02/09/2024)  Received from [facility]   Hunger Vital Sign    Within the past 12 months, you worried that your food would run out before you got the money to buy more.: Never true    Within the past 12 months, the food you bought just didn't last and you didn't have money to get more.: Never true  Transportation Needs: No Transportation Needs (02/09/2024)   Received from [facility] - Transportation    In the past 12 months, has lack of transportation kept you from medical appointments or from getting medications?: No    Lack of Transportation (Non-Medical): No  Physical Activity: Sufficiently Active (02/06/2023)   Received from [facility]   Exercise Vital Sign    On average, how many days per week do you engage in moderate to strenuous exercise (like a brisk walk)?: 5 days    On average, how many minutes do you engage in exercise at this level?: 90 min  Stress: No Stress Concern Present (02/06/2023)   Received from [facility] - Occupational Stress Questionnaire    Feeling of Stress : Not at all  Social Connections: Socially Integrated (02/06/2023)    Received from [facility]   Social Connection and Isolation Panel    In a typical week, how many times do you talk on the phone with family, friends, or neighbors?: More than three times a week    How often do you get together with friends or relatives?: More than three times a week    How often do you attend church or religious services?: More than 4 times per year    Do you belong to any clubs or organizations such as church groups, unions, fraternal or athletic groups, or school groups?: Yes    How often do you attend meetings of the clubs or organizations you belong to?: More than 4 times per year    Are you married, widowed, divorced, separated, never married, or living with a partner?: Married  Intimate Partner Violence: Not on file    Past Medical History, Surgical history, Social history, and Family history were reviewed and updated as appropriate.   Please see review of systems for further details on the patient's review from today.   Objective:   Physical Exam:  There were no vitals taken for this visit.  Physical Exam Neurological:     Mental Status: He is alert and oriented to person, place, and time.     Cranial Nerves: No dysarthria.  Psychiatric:        Attention and Perception: Attention and perception normal.        Mood and Affect: Mood normal. Mood is not anxious or depressed.        Speech: Speech normal. Speech is not slurred.        Behavior: Behavior is cooperative.        Thought Content: Thought content normal. Thought content is not paranoid or delusional. Thought content does not include homicidal or suicidal ideation. Thought content does not include suicidal plan.        Cognition and Memory: Cognition and memory normal.        Judgment: Judgment normal.     Comments: Insight intact     Lab Review:     Component Value Date/Time   NA 140 09/02/2020 0942   K 5.1 09/02/2020 0942   CL 102 09/02/2020  0942   CO2 24 09/02/2020 0942    GLUCOSE 103 (H) 09/02/2020 0942   BUN 16 09/02/2020 0942   CREATININE 1.03 09/02/2020 0942   CALCIUM 9.7 09/02/2020 0942   PROT 7.2 09/02/2020 0942   ALBUMIN 4.4 09/02/2020 0942   AST 24 09/02/2020 0942   ALT 30 09/02/2020 0942   ALKPHOS 99 09/02/2020 0942   BILITOT 0.3 09/02/2020 0942   GFRNONAA 77 09/02/2020 0942   GFRAA 89 09/02/2020 0942       Component Value Date/Time   WBC 3.9 09/02/2020 0942   RBC 5.12 09/02/2020 0942   HGB 14.3 09/02/2020 0942   HCT 44.0 09/02/2020 0942   PLT 254 09/02/2020 0942   MCV 86 09/02/2020 0942   MCH 27.9 09/02/2020 0942   MCHC 32.5 09/02/2020 0942   RDW 12.9 09/02/2020 0942   LYMPHSABS 0.8 09/02/2020 0942   EOSABS 0.1 09/02/2020 0942   BASOSABS 0.1 09/02/2020 0942    No results found for: POCLITH, LITHIUM   No results found for: PHENYTOIN, PHENOBARB, VALPROATE, CBMZ   .res Assessment: Plan:    Paul Fleming was seen today for follow-up.  Diagnoses and all orders for this visit:  Recurrent major depression in complete remission -     PARoxetine  (PAXIL ) 20 MG tablet; Take 1 tablet (20 mg total) by mouth daily. -     buPROPion  (WELLBUTRIN  XL) 300 MG 24 hr tablet; Take 1 tablet (300 mg total) by mouth daily.  Insomnia due to mental condition -     clonazePAM  (KLONOPIN ) 0.5 MG tablet; TAKE 1 TABLET BY MOUTH 2 TIMES DAILY AS NEEDED FOR ANXEITY   30 min face to face time with patient. We discussed extensively his mental health in relation to son's serious manic behavior and hosptialization.  This is ongoing stress and disc options tx for him.  Good response with meds.  Cont longterm bc multiple relapses.  He agrees.  No med changes.  We discussed the short-term risks associated with benzodiazepines including sedation and increased fall risk among others.  Discussed long-term side effect risk including dependence, potential withdrawal symptoms, and the potential eventual dose-related risk of dementia.  But recent studies  from 2020 dispute this association between benzodiazepines and dementia risk. Newer studies in 2020 do not support an association with dementia.  W also happy with him on it. Sober and involved in [place] and committed.  Sober since April 2018.  Med: Med;  Wellbutrin  XL 300, clonazepam  0.5 mg BID prn, paroxetine  20.  Disc son's bipolar, Paul Fleming  FU 1 year.  Lorene Macintosh, MD, DFAPA    Future Appointments  Date Time Provider Department Center  06/17/2024 10:15 AM Hester Alm BROCKS, MD [facility] None     No orders of the defined types were placed in this encounter.     -------------------------------

## 2024-06-17 ENCOUNTER — Ambulatory Visit: Admitting: Dermatology

## 2024-06-17 ENCOUNTER — Encounter: Payer: Self-pay | Admitting: Dermatology

## 2024-06-17 DIAGNOSIS — L578 Other skin changes due to chronic exposure to nonionizing radiation: Secondary | ICD-10-CM

## 2024-06-17 DIAGNOSIS — D1801 Hemangioma of skin and subcutaneous tissue: Secondary | ICD-10-CM

## 2024-06-17 DIAGNOSIS — W908XXA Exposure to other nonionizing radiation, initial encounter: Secondary | ICD-10-CM

## 2024-06-17 DIAGNOSIS — D229 Melanocytic nevi, unspecified: Secondary | ICD-10-CM

## 2024-06-17 DIAGNOSIS — Z7189 Other specified counseling: Secondary | ICD-10-CM

## 2024-06-17 DIAGNOSIS — L4050 Arthropathic psoriasis, unspecified: Secondary | ICD-10-CM

## 2024-06-17 DIAGNOSIS — Z1283 Encounter for screening for malignant neoplasm of skin: Secondary | ICD-10-CM

## 2024-06-17 DIAGNOSIS — Z79899 Other long term (current) drug therapy: Secondary | ICD-10-CM

## 2024-06-17 DIAGNOSIS — L814 Other melanin hyperpigmentation: Secondary | ICD-10-CM

## 2024-06-17 DIAGNOSIS — L409 Psoriasis, unspecified: Secondary | ICD-10-CM

## 2024-06-17 DIAGNOSIS — L821 Other seborrheic keratosis: Secondary | ICD-10-CM

## 2024-06-17 MED ORDER — VTAMA 1 % EX CREA: 1.0000 | TOPICAL_CREAM | Freq: Every day | CUTANEOUS | 11 refills | Status: AC

## 2024-06-17 NOTE — Progress Notes (Signed)
 Follow-Up Visit   Subjective  Paul Fleming is a 68 y.o. male who presents for the following: Skin Cancer Screening and Full Body Skin Exam Patient also here for 6 month follow up on psoriasis, has been doing well on vtama . Has not had to use otezla . Has used vtama  cream very little.   Reports some areas of itch in umbilicus area and elbows.   The patient presents for Total-Body Skin Exam (TBSE) for skin cancer screening and mole check. The patient has spots, moles and lesions to be evaluated, some may be new or changing and the patient may have concern these could be cancer.  The following portions of the chart were reviewed this encounter and updated as appropriate: medications, allergies, medical history  Review of Systems:  No other skin or systemic complaints except as noted in HPI or Assessment and Plan.  Objective  Well appearing patient in no apparent distress; mood and affect are within normal limits.  A full examination was performed including scalp, head, eyes, ears, nose, lips, neck, chest, axillae, abdomen, back, buttocks, bilateral upper extremities, bilateral lower extremities, hands, feet, fingers, toes, fingernails, and toenails. All findings within normal limits unless otherwise noted below.   Relevant physical exam findings are noted in the Assessment and Plan.    Assessment & Plan   PSORIASIS with PSORIATIC ARTHRITIS Patient has improved and is off Otezla  oral treatment currently and feels he is doing fine without treatment except for topical Vtama . Exam: plaques elbows and umbilicus  9% BSA. Chronic condition with duration or expected duration over one year. Currently well-controlled. Psoriasis is a chronic non-curable, but treatable genetic/hereditary disease that may have other systemic features affecting other organ systems such as joints (Psoriatic Arthritis). It is associated with an increased risk of inflammatory bowel disease, heart disease,  non-alcoholic fatty liver disease, and depression.  Treatments include light and laser treatments; topical medications; and systemic medications including oral and injectables. Treatment Plan: Cont Vtama  cr qd prn flares,  [facility]  Patient also give samples of Zoryve 0.3 % cream qd prn flares , if he likes rx, patient instructed to call or send mychart   Discussed may restart Otezla  1 po bid if worsens    Side effects of Otezla  (apremilast ) include diarrhea, nausea, headache, upper respiratory infection, depression, and weight decrease (5-10%). It should only be taken by pregnant women after a discussion regarding risks and benefits with their doctor. Goal is control of skin condition, not cure.  The use of Otezla  requires long term medication management, including periodic office visits.  SKIN CANCER SCREENING PERFORMED TODAY.  ACTINIC DAMAGE - Chronic condition, secondary to cumulative UV/sun exposure - diffuse scaly erythematous macules with underlying dyspigmentation - Recommend daily broad spectrum sunscreen SPF 30+ to sun-exposed areas, reapply every 2 hours as needed.  - Staying in the shade or wearing long sleeves, sun glasses (UVA+UVB protection) and wide brim hats (4-inch brim around the entire circumference of the hat) are also recommended for sun protection.  - Call for new or changing lesions.  LENTIGINES, SEBORRHEIC KERATOSES, HEMANGIOMAS - Benign normal skin lesions - Benign-appearing - Call for any changes  MELANOCYTIC NEVI - Tan-brown and/or pink-flesh-colored symmetric macules and papules - Benign appearing on exam today - Observation - Call clinic for new or changing moles - Recommend daily use of broad spectrum spf 30+ sunscreen to sun-exposed areas.   PSORIASIS   Related Medications Tapinarof  (VTAMA ) 1 % CREA Apply 1 application  topically daily. Apply to psoriasis QD PRN. For psoriasis Return for 1 year psoriasis / tbse .  IEleanor Blush, CMA, am acting as scribe for Alm Rhyme, MD.   Documentation: I have reviewed the above documentation for accuracy and completeness, and I agree with the above.  Alm Rhyme, MD

## 2024-06-17 NOTE — Patient Instructions (Addendum)
 Your prescription was sent to [facility] in [place]. A representative from [facility] will contact you within 3 business hours to verify your address and insurance information to schedule a free delivery. If for any reason you do not receive a phone call from them, please reach out to them. Their phone number is [phone] and their hours are Monday-Friday 9:00 am-5:00 pm.   Let us  know if medication is more than 35 dollars send mychart or call   Due to recent changes in healthcare laws, you may see results of your pathology and/or laboratory studies on MyChart before the doctors have had a chance to review them. We understand that in some cases there may be results that are confusing or concerning to you. Please understand that not all results are received at the same time and often the doctors may need to interpret multiple results in order to provide you with the best plan of care or course of treatment. Therefore, we ask that you please give us  2 business days to thoroughly review all your results before contacting the office for clarification. Should we see a critical lab result, you will be contacted sooner.   If You Need Anything After Your Visit  If you have any questions or concerns for your doctor, please call our main line at [phone] and press option 4 to reach your doctor's medical assistant. If no one answers, please leave a voicemail as directed and we will return your call as soon as possible. Messages left after 4 pm will be answered the following business day.   You may also send us  a message via MyChart. We typically respond to MyChart messages within 1-2 business days.  For prescription refills, please ask your pharmacy to contact our office. Our fax number is [phone].  If you have an urgent issue when the clinic is closed that cannot wait until the next business day, you can page your doctor at the number below.    Please note that while we do our best  to be available for urgent issues outside of office hours, we are not available 24/7.   If you have an urgent issue and are unable to reach us , you may choose to seek medical care at your doctor's office, retail clinic, urgent care center, or emergency room.  If you have a medical emergency, please immediately call 911 or go to the emergency department.  Pager Numbers  - Dr. Hester: [phone]  - Dr. Jackquline: [phone]  - Dr. Claudene: [phone]   - Dr. Raymund: [phone]  In the event of inclement weather, please call our main line at [phone] for an update on the status of any delays or closures.  Dermatology Medication Tips: Please keep the boxes that topical medications come in in order to help keep track of the instructions about where and how to use these. Pharmacies typically print the medication instructions only on the boxes and not directly on the medication tubes.   If your medication is too expensive, please contact our office at [phone] option 4 or send us  a message through MyChart.   We are unable to tell what your co-pay for medications will be in advance as this is different depending on your insurance coverage. However, we may be able to find a substitute medication at lower cost or fill out paperwork to get insurance to cover a needed medication.   If a prior authorization is required to get your medication covered by your insurance company, please allow  us  1-2 business days to complete this process.  Drug prices often vary depending on where the prescription is filled and some pharmacies may offer cheaper prices.  The website www.goodrx.com contains coupons for medications through different pharmacies. The prices here do not account for what the cost may be with help from insurance (it may be cheaper with your insurance), but the website can give you the price if you did not use any insurance.  - You can print the associated coupon and take it with  your prescription to the pharmacy.  - You may also stop by our office during regular business hours and pick up a GoodRx coupon card.  - If you need your prescription sent electronically to a different pharmacy, notify our office through [facility] or by phone at [phone] option 4.     Si Usted Necesita Algo Despus de Su Visita  Tambin puede enviarnos un mensaje a travs de Clinical cytogeneticist. Por lo general respondemos a los mensajes de MyChart en el transcurso de 1 a 2 das hbiles.  Para renovar recetas, por favor pida a su farmacia que se ponga en contacto con nuestra oficina. Randi lakes de fax es [place] [phone].  Si tiene un asunto urgente cuando la clnica est cerrada y que no puede esperar hasta el siguiente da hbil, puede llamar/localizar a su doctor(a) al nmero que aparece a continuacin.   Por favor, tenga en cuenta que aunque hacemos todo lo posible para estar disponibles para asuntos urgentes fuera del horario de [place], no estamos disponibles las 24 horas del da, los 7 [address]  [address] de la [place].   Si tiene un problema urgente y no puede comunicarse con nosotros, puede optar por buscar atencin mdica  en el consultorio de su doctor(a), en una clnica privada, en un centro de atencin urgente o en una sala de emergencias.  Si tiene Engineer, drilling, por favor llame inmediatamente al 911 o vaya a la sala de emergencias.  Nmeros de bper  - Dr. Hester: [phone]  - Dra. Jackquline: [phone]  - Dr. Claudene: [phone]  - Dra. Kitts: [phone]  En caso de inclemencias del [place], por favor llame a nuestra lnea principal al [phone] para una actualizacin sobre el estado de cualquier retraso o cierre.  Consejos para la medicacin en dermatologa: Por favor, guarde las cajas en las que vienen los medicamentos de uso tpico para ayudarle a seguir las instrucciones sobre dnde y cmo usarlos. Las farmacias generalmente imprimen las instrucciones del  medicamento slo en las cajas y no directamente en los tubos del [place].   Si su medicamento es muy caro, por favor, pngase en contacto con landry rieger llamando al [phone] y presione la opcin 4 o envenos un mensaje a travs de Clinical cytogeneticist.   No podemos decirle cul ser su copago por los medicamentos por adelantado ya que esto es diferente dependiendo de la cobertura de su seguro. Sin embargo, es posible que podamos encontrar un medicamento sustituto a Audiological scientist un formulario para que el seguro cubra el medicamento que se considera necesario.   Si se requiere una autorizacin previa para que su compaa de seguros malta su medicamento, por favor permtanos de 1 a 2 das hbiles para completar este proceso.  Los precios de los medicamentos varan con frecuencia dependiendo del Environmental consultant de dnde se surte la receta y alguna farmacias pueden ofrecer precios ms baratos.  El sitio web www.goodrx.com tiene cupones para medicamentos de Health and safety inspector. Los precios aqu no tienen  en cuenta lo que podra costar con la ayuda del seguro (puede ser ms barato con su seguro), pero el sitio web puede darle el precio si no Visual merchandiser.  - Puede imprimir el cupn correspondiente y llevarlo con su receta a la farmacia.  - Tambin puede pasar por nuestra oficina durante el horario de atencin regular y Education officer, museum una tarjeta de cupones de GoodRx.  - Si necesita que su receta se enve electrnicamente a una farmacia diferente, informe a nuestra oficina a travs de MyChart de [place] o por telfono llamando al [phone] y presione la opcin 4.

## 2024-08-16 ENCOUNTER — Other Ambulatory Visit: Payer: Self-pay | Admitting: Psychiatry

## 2024-08-16 DIAGNOSIS — F5105 Insomnia due to other mental disorder: Secondary | ICD-10-CM

## 2025-06-23 ENCOUNTER — Ambulatory Visit: Admitting: Dermatology
